# Patient Record
Sex: Female | Born: 1940 | Race: Black or African American | Hispanic: No | Marital: Single | Smoking: Never smoker
Health system: Southern US, Community
[De-identification: ages and names within clinical notes are randomized; demographics above are authoritative.]

## PROBLEM LIST (undated history)

## (undated) DIAGNOSIS — S92919A Unspecified fracture of unspecified toe(s), initial encounter for closed fracture: Secondary | ICD-10-CM

## (undated) DIAGNOSIS — M859 Disorder of bone density and structure, unspecified: Secondary | ICD-10-CM

## (undated) DIAGNOSIS — M8580 Other specified disorders of bone density and structure, unspecified site: Secondary | ICD-10-CM

## (undated) DIAGNOSIS — E7800 Pure hypercholesterolemia, unspecified: Secondary | ICD-10-CM

## (undated) DIAGNOSIS — K219 Gastro-esophageal reflux disease without esophagitis: Secondary | ICD-10-CM

## (undated) DIAGNOSIS — I10 Essential (primary) hypertension: Secondary | ICD-10-CM

## (undated) DIAGNOSIS — K429 Umbilical hernia without obstruction or gangrene: Secondary | ICD-10-CM

## (undated) HISTORY — PX: BUNIONECTOMY: SHX129

## (undated) HISTORY — DX: Unspecified fracture of unspecified toe(s), initial encounter for closed fracture: S92.919A

## (undated) HISTORY — PX: CHOLECYSTECTOMY: SHX55

## (undated) HISTORY — PX: ABDOMINAL HYSTERECTOMY: SHX81

---

## 2004-09-06 ENCOUNTER — Ambulatory Visit: Payer: Self-pay | Admitting: Family Medicine

## 2005-09-22 ENCOUNTER — Ambulatory Visit: Payer: Self-pay | Admitting: Nurse Practitioner

## 2006-11-21 ENCOUNTER — Ambulatory Visit: Payer: Self-pay | Admitting: Family Medicine

## 2008-01-28 ENCOUNTER — Ambulatory Visit: Payer: Self-pay | Admitting: Gastroenterology

## 2008-02-05 ENCOUNTER — Ambulatory Visit: Payer: Self-pay | Admitting: Family Medicine

## 2008-03-11 ENCOUNTER — Ambulatory Visit: Payer: Self-pay | Admitting: Gastroenterology

## 2008-05-12 ENCOUNTER — Ambulatory Visit: Payer: Self-pay | Admitting: Surgery

## 2008-05-12 ENCOUNTER — Ambulatory Visit: Payer: Self-pay | Admitting: Cardiology

## 2008-05-19 ENCOUNTER — Ambulatory Visit: Payer: Self-pay | Admitting: Surgery

## 2009-03-30 ENCOUNTER — Ambulatory Visit: Payer: Self-pay | Admitting: Family Medicine

## 2010-03-31 ENCOUNTER — Ambulatory Visit: Payer: Self-pay | Admitting: Family Medicine

## 2011-06-01 ENCOUNTER — Ambulatory Visit: Payer: Self-pay | Admitting: Family Medicine

## 2011-06-15 ENCOUNTER — Ambulatory Visit: Payer: Self-pay | Admitting: Family Medicine

## 2011-07-14 ENCOUNTER — Ambulatory Visit: Payer: Self-pay | Admitting: Nurse Practitioner

## 2012-06-19 ENCOUNTER — Ambulatory Visit: Payer: Self-pay | Admitting: Family Medicine

## 2013-04-29 ENCOUNTER — Ambulatory Visit: Payer: Self-pay | Admitting: Family Medicine

## 2013-06-21 ENCOUNTER — Ambulatory Visit: Payer: Self-pay | Admitting: Family Medicine

## 2013-09-09 ENCOUNTER — Ambulatory Visit: Payer: Self-pay | Admitting: Family Medicine

## 2014-06-11 ENCOUNTER — Other Ambulatory Visit: Payer: Self-pay

## 2014-06-11 DIAGNOSIS — Z1231 Encounter for screening mammogram for malignant neoplasm of breast: Secondary | ICD-10-CM

## 2014-06-26 ENCOUNTER — Ambulatory Visit
Admission: RE | Admit: 2014-06-26 | Discharge: 2014-06-26 | Disposition: A | Discharge status: Home / Self Care | Payer: Medicare Other | Source: Ambulatory Visit | Attending: Family Medicine | Admitting: Family Medicine

## 2014-06-26 ENCOUNTER — Other Ambulatory Visit: Payer: Self-pay

## 2014-06-26 DIAGNOSIS — Z1231 Encounter for screening mammogram for malignant neoplasm of breast: Secondary | ICD-10-CM | POA: Insufficient documentation

## 2015-01-15 ENCOUNTER — Emergency Department
Admission: EM | Admit: 2015-01-15 | Discharge: 2015-01-15 | Disposition: A | Discharge status: Home / Self Care | Payer: Medicare Other | Attending: Emergency Medicine | Admitting: Emergency Medicine

## 2015-01-15 ENCOUNTER — Encounter: Payer: Self-pay | Admitting: Emergency Medicine

## 2015-01-15 DIAGNOSIS — K5900 Constipation, unspecified: Secondary | ICD-10-CM | POA: Diagnosis not present

## 2015-01-15 DIAGNOSIS — R1030 Lower abdominal pain, unspecified: Secondary | ICD-10-CM

## 2015-01-15 DIAGNOSIS — K649 Unspecified hemorrhoids: Secondary | ICD-10-CM | POA: Diagnosis not present

## 2015-01-15 DIAGNOSIS — I10 Essential (primary) hypertension: Secondary | ICD-10-CM | POA: Diagnosis not present

## 2015-01-15 DIAGNOSIS — Z91040 Latex allergy status: Secondary | ICD-10-CM | POA: Diagnosis not present

## 2015-01-15 DIAGNOSIS — Z79899 Other long term (current) drug therapy: Secondary | ICD-10-CM | POA: Insufficient documentation

## 2015-01-15 HISTORY — DX: Other specified disorders of bone density and structure, unspecified site: M85.80

## 2015-01-15 HISTORY — DX: Disorder of bone density and structure, unspecified: M85.9

## 2015-01-15 HISTORY — DX: Pure hypercholesterolemia, unspecified: E78.00

## 2015-01-15 HISTORY — DX: Essential (primary) hypertension: I10

## 2015-01-15 LAB — URINALYSIS COMPLETE WITH MICROSCOPIC (ARMC ONLY)
BILIRUBIN URINE: NEGATIVE
Bacteria, UA: NONE SEEN
Glucose, UA: NEGATIVE mg/dL
HGB URINE DIPSTICK: NEGATIVE
KETONES UR: NEGATIVE mg/dL
LEUKOCYTES UA: NEGATIVE
NITRITE: NEGATIVE
PH: 5 (ref 5.0–8.0)
Protein, ur: NEGATIVE mg/dL
RBC / HPF: NONE SEEN RBC/hpf (ref 0–5)
Specific Gravity, Urine: 1.011 (ref 1.005–1.030)

## 2015-01-15 LAB — CBC
HEMATOCRIT: 41.4 % (ref 35.0–47.0)
Hemoglobin: 14.1 g/dL (ref 12.0–16.0)
MCH: 29.9 pg (ref 26.0–34.0)
MCHC: 34 g/dL (ref 32.0–36.0)
MCV: 88.1 fL (ref 80.0–100.0)
Platelets: 271 10*3/uL (ref 150–440)
RBC: 4.7 MIL/uL (ref 3.80–5.20)
RDW: 14 % (ref 11.5–14.5)
WBC: 10.3 10*3/uL (ref 3.6–11.0)

## 2015-01-15 MED ORDER — LACTULOSE 10 GM/15ML PO SOLN
30.0000 g | Freq: Once | ORAL | Status: AC
  Administered 2015-01-15: 30 g via ORAL
  Filled 2015-01-15: qty 60

## 2015-01-15 MED ORDER — FLEET ENEMA 7-19 GM/118ML RE ENEM
1.0000 | ENEMA | Freq: Once | RECTAL | Status: AC
  Administered 2015-01-15: 1 via RECTAL

## 2015-01-15 MED ORDER — LACTULOSE 10 GM/15ML PO SOLN: 30.0000 g | Freq: Every day | ORAL | Status: DC | PRN

## 2015-01-15 NOTE — ED Provider Notes (Signed)
[facility] Emergency Department Provider Note  ____________________________________________  Time seen: Approximately 11:39 AM  I have reviewed the triage vital signs and the nursing notes.   HISTORY  Chief Complaint Constipation and Hemorrhoids    HPI Cassie Walters is a 74 y.o. female with a history of chronic constipation, hemorrhoids, presenting with constipation. Patient states that over the past 2 days she has been unable to have a bowel movement despite having the urge. She said that she is straining with no result. She is still passing gas and does not have any nausea or vomiting.  She has some mild suprapubic pressure sensation but no abdominal pain. No fevers or chills. No pain or burning with urination. She has tried stool softener and polyethylene glycol without any result.   Past Medical History  Diagnosis Date  . Hypertension   . Low bone density   . High cholesterol     There are no active problems to display for this patient.   Past Surgical History  Procedure Laterality Date  . Abdominal hysterectomy    . Cholecystectomy    . Bunionectomy Bilateral     Current Outpatient Rx  Name  Route  Sig  Dispense  Refill  . alendronate (FOSAMAX) 70 MG tablet   Oral   Take 70 mg by mouth once a week.          Marland Kitchen amLODipine (NORVASC) 5 MG tablet   Oral   Take 5 mg by mouth daily.         . Calcium Carb-Cholecalciferol (CALCIUM-VITAMIN D) 600-400 MG-UNIT TABS   Oral   Take 1 tablet by mouth 2 (two) times daily.         . cetirizine (ZYRTEC) 10 MG tablet   Oral   Take 10 mg by mouth daily.         Marland Kitchen losartan-hydrochlorothiazide (HYZAAR) 50-12.5 MG tablet   Oral   Take 1 tablet by mouth daily.         Marland Kitchen omeprazole (PRILOSEC) 20 MG capsule   Oral   Take 20 mg by mouth daily.         . pravastatin (PRAVACHOL) 20 MG tablet   Oral   Take 20 mg by mouth daily.         Marland Kitchen lactulose (CHRONULAC) 10 GM/15ML solution    Oral   Take 45 mLs (30 g total) by mouth daily as needed for mild constipation.   90 mL   0     Allergies Latex; Oxycodone; and Sulfa antibiotics  No family history on file.  Social History Social History  Substance Use Topics  . Smoking status: Never Smoker   . Smokeless tobacco: Not on file  . Alcohol Use: No    Review of Systems Constitutional: No fever/chills. No lightheadedness or syncope. Eyes: No visual changes. ENT: No sore throat. Cardiovascular: Denies chest pain, palpitations. Respiratory: Denies shortness of breath.  No cough. Gastrointestinal: No abdominal pain ;: Positive suprapubic pressure..  No nausea, no vomiting.  No diarrhea.  Positive constipation. Genitourinary: Negative for dysuria. Musculoskeletal: Negative for back pain. Skin: Negative for rash. Neurological: Negative for headaches, focal weakness or numbness.  10-point ROS otherwise negative.  ____________________________________________   PHYSICAL EXAM:  VITAL SIGNS: ED Triage Vitals  Enc Vitals Group     BP 01/15/15 0915 142/65 mmHg     Pulse Rate 01/15/15 0916 87     Resp 01/15/15 0916 16     Temp 01/15/15 0916  98.2 F (36.8 C)     Temp Source 01/15/15 0916 Oral     SpO2 01/15/15 0916 98 %     Weight 01/15/15 0916 169 lb 8.5 oz (76.9 kg)     Height 01/15/15 0916 5\' 5"  (1.651 m)     Head Cir --      Peak Flow --      Pain Score --      Pain Loc --      Pain Edu? --      Excl. in GC? --     Constitutional: Alert and oriented. Well appearing and in no acute distress. Answer question appropriately. Eyes: Conjunctivae are normal.  EOMI. no scleral icterus. Head: Atraumatic. Nose: No congestion/rhinnorhea. Mouth/Throat: Mucous membranes are moist.  Neck: No stridor.  Supple.   Cardiovascular: Normal rate, regular rhythm. No murmurs, rubs or gallops.  Respiratory: Normal respiratory effort.  No retractions. Lungs CTAB.  No wheezes, rales or ronchi. Gastrointestinal: Abdomen is  soft, nontender and nondistended. No guarding or rebound. No peritoneal signs. Negative Murphy sign. Patient is a palpable bladder that is approximate 4 cm above the pubic symphysis.  Musculoskeletal: No LE edema.  Neurologic:  Normal speech and language. No gross focal neurologic deficits are appreciated.  Skin:  Skin is warm, dry and intact. No rash noted. Psychiatric: Mood and affect are normal. Speech and behavior are normal.  Normal judgement  ____________________________________________   LABS (all labs ordered are listed, but only abnormal results are displayed)  Labs Reviewed  URINALYSIS COMPLETEWITH MICROSCOPIC ([facility] ONLY) - Abnormal; Notable for the following:    Color, Urine YELLOW (*)    APPearance CLEAR (*)    Squamous Epithelial / LPF 0-5 (*)    All other components within normal limits  CBC   ____________________________________________  EKG  Not indicated ____________________________________________  RADIOLOGY  No results found.  ____________________________________________   PROCEDURES  Procedure(s) performed: None  Critical Care performed: No ____________________________________________   INITIAL IMPRESSION / ASSESSMENT AND PLAN / ED COURSE  Pertinent labs & imaging results that were available during my care of the patient were reviewed by me and considered in my medical decision making (see chart for details).  74 y.o. female with a history of chronic constipation here for the urge to defecate without being able to in the last 2 days. She does not have signs or symptoms that would be concerning with obstructive pathology today. I do not think she has a new surgical pathology either. I will plan to decompress her and reevaluate her for symptomatically relief. She has had some pressure over the suprapubic area so I will get a urine to rule out UTI.   ----------------------------------------- 1:05 PM on  01/15/2015 -----------------------------------------  Patient did have a bowel movement after lactulose and the enema. She is feeling much better. Her labs are reassuring, she has normal hematocrit and stable vital signs, and her urine does not show sign of infection. I'll plan to discharge her home with return precautions as well as follow up instructions. ____________________________________________  FINAL CLINICAL IMPRESSION(S) / ED DIAGNOSES  Final diagnoses:  Constipation, unspecified constipation type  Suprapubic pain, unspecified laterality      NEW MEDICATIONS STARTED DURING THIS VISIT:  New Prescriptions   LACTULOSE (CHRONULAC) 10 GM/15ML SOLUTION    Take 45 mLs (30 g total) by mouth daily as needed for mild constipation.      Rockne MenghiniAnne-Caroline Dade Rodin, MD 01/15/15 1541

## 2015-01-15 NOTE — Discharge Instructions (Signed)
Please continue to eat a high-fiber diet to prevent constipation. Drink plenty of fluid as well. You may continue to take the polyethylene glycol or MiraLAX to help soften stools. If you develop severe constipation, you may take lactulose as prescribed.   Please return to the emergency department if he develops severe abdominal pain, nausea or vomiting, fever, or any other symptoms concerning to you.

## 2015-01-15 NOTE — ED Notes (Signed)
Pt reports she was able to have a BM, pt requesting discharge.

## 2015-01-15 NOTE — ED Notes (Signed)
Pt to ED via EMS from home c/o constipation. LBM yesterday, states she tried to go today but nothing comes out. No c/o pain at this time. Pt reports some minor bleeding which she states is from hemorrhoid. No c/o n/v.

## 2015-06-11 ENCOUNTER — Other Ambulatory Visit: Payer: Self-pay | Admitting: Family Medicine

## 2015-06-11 DIAGNOSIS — Z1231 Encounter for screening mammogram for malignant neoplasm of breast: Secondary | ICD-10-CM

## 2015-06-29 ENCOUNTER — Other Ambulatory Visit: Payer: Self-pay | Admitting: Family Medicine

## 2015-06-29 ENCOUNTER — Ambulatory Visit
Admission: RE | Admit: 2015-06-29 | Discharge: 2015-06-29 | Disposition: A | Discharge status: Home / Self Care | Payer: Medicare Other | Source: Ambulatory Visit | Attending: Family Medicine | Admitting: Family Medicine

## 2015-06-29 DIAGNOSIS — Z1231 Encounter for screening mammogram for malignant neoplasm of breast: Secondary | ICD-10-CM

## 2015-09-02 ENCOUNTER — Other Ambulatory Visit: Payer: Self-pay | Admitting: Family Medicine

## 2015-09-02 DIAGNOSIS — M8580 Other specified disorders of bone density and structure, unspecified site: Secondary | ICD-10-CM

## 2016-06-24 ENCOUNTER — Other Ambulatory Visit: Payer: Self-pay | Admitting: Family Medicine

## 2016-06-24 DIAGNOSIS — Z1231 Encounter for screening mammogram for malignant neoplasm of breast: Secondary | ICD-10-CM

## 2016-07-05 ENCOUNTER — Ambulatory Visit
Admission: RE | Admit: 2016-07-05 | Discharge: 2016-07-05 | Disposition: A | Discharge status: Home / Self Care | Payer: Medicare Other | Source: Ambulatory Visit | Attending: Family Medicine | Admitting: Family Medicine

## 2016-07-05 DIAGNOSIS — Z1231 Encounter for screening mammogram for malignant neoplasm of breast: Secondary | ICD-10-CM | POA: Insufficient documentation

## 2016-09-28 ENCOUNTER — Other Ambulatory Visit: Payer: Self-pay | Admitting: Family Medicine

## 2016-09-28 DIAGNOSIS — Z13820 Encounter for screening for osteoporosis: Secondary | ICD-10-CM

## 2016-11-09 ENCOUNTER — Ambulatory Visit
Admission: RE | Admit: 2016-11-09 | Discharge: 2016-11-09 | Disposition: A | Discharge status: Home / Self Care | Payer: Medicare Other | Source: Ambulatory Visit | Attending: Family Medicine | Admitting: Family Medicine

## 2016-11-09 DIAGNOSIS — Z13820 Encounter for screening for osteoporosis: Secondary | ICD-10-CM | POA: Diagnosis not present

## 2016-11-09 DIAGNOSIS — M85852 Other specified disorders of bone density and structure, left thigh: Secondary | ICD-10-CM | POA: Insufficient documentation

## 2017-01-25 ENCOUNTER — Encounter (INDEPENDENT_AMBULATORY_CARE_PROVIDER_SITE_OTHER): Payer: Self-pay

## 2017-01-25 ENCOUNTER — Ambulatory Visit: Payer: Medicare Other | Admitting: Gastroenterology

## 2017-01-25 ENCOUNTER — Encounter: Payer: Self-pay | Admitting: Gastroenterology

## 2017-01-25 VITALS — BP 153/85 | HR 81 | Temp 98.4°F | Ht 66.0 in | Wt 163.4 lb

## 2017-01-25 DIAGNOSIS — S92919A Unspecified fracture of unspecified toe(s), initial encounter for closed fracture: Secondary | ICD-10-CM

## 2017-01-25 DIAGNOSIS — K5900 Constipation, unspecified: Secondary | ICD-10-CM

## 2017-01-25 DIAGNOSIS — R079 Chest pain, unspecified: Secondary | ICD-10-CM

## 2017-01-25 HISTORY — DX: Unspecified fracture of unspecified toe(s), initial encounter for closed fracture: S92.919A

## 2017-01-25 NOTE — Progress Notes (Signed)
Wyline MoodKiran Clarissa Laird MD, MRCP(U.K) [address]  [address]  [place], [place] [address]  Main: [phone]  Fax: [phone]   Gastroenterology Consultation  Referring Provider:     Leanna SatoMiles, Linda M, MD Primary Care Physician:  Leanna SatoMiles, Linda M, MD Primary Gastroenterologist:  Dr. Wyline MoodKiran Sloane Palmer  Reason for Consultation:     Chest pain         HPI:   Lauretta GrillLouise Walters is a 76 y.o. y/o female referred for consultation & management  by Dr. Marvis MoellerMiles, Doralee AlbinoLinda M, MD.    She was seen 11 days back on 01/14/17 for constipation .Given lactulose and enemas, had a bowel movement and was discharged.   Says she has had chest pain -began a few weeks back, presently no pain, took omeprazole and it helped. Center of her chest , went into her throat . Pain across her chest . No worse with walking . Felt tired. Denied any palpitations. No prior similar episodes . Denies any pain since 10 days. Denies any heart issues in her family. She has high lipids, HTN, no diabetes. Never smoked.   Taking omeprazole daily. 20 mg -she takes it after meals.  On Fosamax- recalls may have just taken fosamax the day she had the chest pain. She has had an EGD in the past. No issues with dysphagia. No weight loss.    She has had constipation for many years. Not taken any laxatives. She has a bowel movement every day - sometimes hard . Takes miralax- daily. Works well for her. Feels she is happy at this time with regards to her constipation.     Past Medical History:  Diagnosis Date  . High cholesterol   . Hypertension   . Low bone density     Past Surgical History:  Procedure Laterality Date  . ABDOMINAL HYSTERECTOMY    . BUNIONECTOMY Bilateral   . CHOLECYSTECTOMY      Prior to Admission medications   Medication Sig Start Date End Date Taking? Authorizing Provider  alendronate (FOSAMAX) 70 MG tablet Take 70 mg by mouth once a week.  12/04/14  Yes [provider]  amLODipine (NORVASC) 5 MG tablet Take 5 mg by mouth  daily.   Yes [provider]  Calcium Carb-Cholecalciferol (CALCIUM-VITAMIN D) 600-400 MG-UNIT TABS Take 1 tablet by mouth 2 (two) times daily.   Yes [provider]  cetirizine (ZYRTEC) 10 MG tablet Take 10 mg by mouth daily.   Yes [provider]  clotrimazole (LOTRIMIN) 1 % cream  01/13/17  Yes [provider]  diclofenac (VOLTAREN) 50 MG EC tablet diclofenac sodium 50 mg tablet,delayed release   Yes [provider]  lactulose (CHRONULAC) 10 GM/15ML solution Take 45 mLs (30 g total) by mouth daily as needed for mild constipation. 01/15/15  Yes Rockne MenghiniNorman, Anne-Caroline, MD  losartan-hydrochlorothiazide (HYZAAR) 50-12.5 MG tablet Take 1 tablet by mouth daily.   Yes [provider]  omeprazole (PRILOSEC) 20 MG capsule Take 20 mg by mouth daily.   Yes [provider]  polyethylene glycol powder (GLYCOLAX/MIRALAX) powder  01/11/17  Yes [provider]  pravastatin (PRAVACHOL) 20 MG tablet Take 20 mg by mouth daily.   Yes [provider]    History reviewed. No pertinent family history.   Social History   Tobacco Use  . Smoking status: Never Smoker  . Smokeless tobacco: Never Used  Substance Use Topics  . Alcohol use: No  . Drug use: No    Allergies as of  01/25/2017 - Review Complete 01/25/2017  Allergen Reaction Noted  . Hydrocodone    . Latex  01/15/2015  . Oxycodone  01/15/2015  . Sulfa antibiotics  01/15/2015    Review of Systems:    All systems reviewed and negative except where noted in HPI.   Physical Exam:  BP (!) 153/85 (BP Location: Left Arm, Patient Position: Sitting, Cuff Size: Normal)   Pulse 81   Temp 98.4 F (36.9 C) (Oral)   Ht 5\' 6"  (1.676 m)   Wt 163 lb 6.4 oz (74.1 kg)   BMI 26.37 kg/m  No LMP recorded. Patient has had a hysterectomy. Psych:  Alert and cooperative. Normal mood and affect. General:   Alert,  Well-developed, well-nourished, pleasant and cooperative in NAD Head:   Normocephalic and atraumatic. Eyes:  Sclera clear, no icterus.   Conjunctiva pink. Ears:  Normal auditory acuity. Nose:  No deformity, discharge, or lesions. Mouth:  No deformity or lesions,oropharynx pink & moist. Neck:  Supple; no masses or thyromegaly. Lungs:  Respirations even and unlabored.  Clear throughout to auscultation.   No wheezes, crackles, or rhonchi. No acute distress. Heart:  Regular rate and rhythm; no murmurs, clicks, rubs, or gallops. Abdomen:  Normal bowel sounds.  No bruits.  Soft, non-tender and non-distended without masses, hepatosplenomegaly or hernias noted.  No guarding or rebound tenderness.    Msk:  Symmetrical without gross deformities. Good, equal movement & strength bilaterally. Pulses:  Normal pulses noted. Extremities:  No clubbing or edema.  No cyanosis. Neurologic:  Alert and oriented x3;  grossly normal neurologically. Skin:  Intact without significant lesions or rashes. No jaundice. Lymph Nodes:  No significant cervical adenopathy. Psych:  Alert and cooperative. Normal mood and affect.  Imaging Studies: No results found.  Assessment and Plan:   Lauretta GrillLouise Mees is a 76 y.o. y/o female has been referred for chest pain and constipation   Plan  1.Refer to cardiologist - she has some cardiovascular risk factors. R/o angina. Her chest pain is not typical of angina , could also be from pill esophagitis (fosamax) or acute esophagitis. Presently has no symptoms and hence does not warrant any further endoscopic evaluation . Symptoms were for a very short period of time. D/c PPI in 2 weeks  2. Constipation- long standing doing well on miralax- suggest high fiber diet- she is not keen on a colonoscopy ( I did offer her )   Follow up in 8 weeks if has recurrence of symptoms will consider further evaluation.   Dr Wyline MoodKiran Ariston Grandison MD,MRCP(U.K)

## 2017-02-22 ENCOUNTER — Ambulatory Visit: Payer: Medicare Other | Admitting: Gastroenterology

## 2017-03-09 ENCOUNTER — Ambulatory Visit (INDEPENDENT_AMBULATORY_CARE_PROVIDER_SITE_OTHER): Payer: Medicare Other | Admitting: Cardiovascular Disease

## 2017-03-09 ENCOUNTER — Encounter: Payer: Self-pay | Admitting: Cardiovascular Disease

## 2017-03-09 VITALS — BP 130/66 | HR 78 | Ht 64.0 in | Wt 165.0 lb

## 2017-03-09 DIAGNOSIS — E785 Hyperlipidemia, unspecified: Secondary | ICD-10-CM

## 2017-03-09 DIAGNOSIS — I10 Essential (primary) hypertension: Secondary | ICD-10-CM

## 2017-03-09 DIAGNOSIS — R079 Chest pain, unspecified: Secondary | ICD-10-CM | POA: Diagnosis not present

## 2017-03-09 NOTE — Progress Notes (Signed)
Cardiology Office Note   Date:  03/09/2017   ID:  Cassie GrillLouise Albea, DOB 14-Mar-1940, MRN [id number]  PCP:  Iran OuchArida, Yousif Edelson A, MD  Cardiologist:   Lorine BearsMuhammad Kasidi Shanker, MD   Chief Complaint  Patient presents with  . New Patient (Initial Visit)    Per Dr. Tobi BastosAnna in gastro for Chest pain. Meds reviewed verbally with patient.       History of Present Illness: Cassie Walters is a 77 y.o. female who was referred by Dr. Tobi BastosAnna for evaluation of chest pain.  She has no prior cardiac history.  She has known history of hypertension and hyperlipidemia.  No previous cardiac testing. She reports intermittent episodes of chest pain.  She describes different types of pain.  She clearly describes discomfort swallowing certain foods and sometimes chest with swallowing.  She is on Fosamax.  She also describes substernal tightness feeling which happens with certain activities.  No shortness of breath, orthopnea or PND.  No significant leg edema. She is a lifelong non-smoker and has no family history of premature coronary artery disease.  Past Medical History:  Diagnosis Date  . High cholesterol   . Hypertension   . Low bone density     Past Surgical History:  Procedure Laterality Date  . ABDOMINAL HYSTERECTOMY    . BUNIONECTOMY Bilateral   . CHOLECYSTECTOMY       Current Outpatient Medications  Medication Sig Dispense Refill  . alendronate (FOSAMAX) 70 MG tablet Take 70 mg by mouth once a week.     Marland Kitchen. amLODipine (NORVASC) 5 MG tablet Take 5 mg by mouth daily.    . Calcium Carb-Cholecalciferol (CALCIUM-VITAMIN D) 600-400 MG-UNIT TABS Take 1 tablet by mouth 2 (two) times daily.    . cetirizine (ZYRTEC) 10 MG tablet Take 10 mg by mouth daily.    . clotrimazole (LOTRIMIN) 1 % cream     . diclofenac (VOLTAREN) 50 MG EC tablet diclofenac sodium 50 mg tablet,delayed release    . lactulose (CHRONULAC) 10 GM/15ML solution Take 45 mLs (30 g total) by mouth daily as needed for mild constipation. 90 mL 0  .  losartan-hydrochlorothiazide (HYZAAR) 50-12.5 MG tablet Take 1 tablet by mouth daily.    . polyethylene glycol powder (GLYCOLAX/MIRALAX) powder     . pravastatin (PRAVACHOL) 20 MG tablet Take 20 mg by mouth daily.     No current facility-administered medications for this visit.     Allergies:   Hydrocodone; Latex; Oxycodone; and Sulfa antibiotics    Social History:  The patient  reports that  has never smoked. she has never used smokeless tobacco. She reports that she does not drink alcohol or use drugs.   Family History:  The patient's family history is negative for coronary artery disease.   ROS:  Please see the history of present illness.   Otherwise, review of systems are positive for none.   All other systems are reviewed and negative.    PHYSICAL EXAM: VS:  BP 130/66 (BP Location: Right Arm, Patient Position: Sitting, Cuff Size: Normal)   Pulse 78   Ht 5\' 4"  (1.626 m)   Wt 165 lb (74.8 kg)   BMI 28.32 kg/m  , BMI Body mass index is 28.32 kg/m. GEN: Well nourished, well developed, in no acute distress  HEENT: normal  Neck: no JVD, carotid bruits, or masses Cardiac: RRR; no murmurs, rubs, or gallops,no edema  Respiratory:  clear to auscultation bilaterally, normal work of breathing GI: soft, nontender, nondistended, + BS MS:  no deformity or atrophy  Skin: warm and dry, no rash Neuro:  Strength and sensation are intact Psych: euthymic mood, full affect   EKG:  EKG is ordered today. The ekg ordered today demonstrates normal sinus rhythm with no significant ST or T wave changes.   Recent Labs: No results found for requested labs within last 8760 hours.    Lipid Panel No results found for: CHOL, TRIG, HDL, CHOLHDL, VLDL, LDLCALC, LDLDIRECT    Wt Readings from Last 3 Encounters:  03/09/17 165 lb (74.8 kg)  01/25/17 163 lb 6.4 oz (74.1 kg)  01/15/15 169 lb 8.5 oz (76.9 kg)        PAD Screen 03/09/2017  Previous PAD dx? No  Previous surgical procedure? No    Pain with walking? No  Feet/toe relief with dangling? No  Painful, non-healing ulcers? No  Extremities discolored? No      ASSESSMENT AND PLAN:  1.  Atypical chest pain: She has 2 components.  One component seems to be due to esophagitis or dysphagia could be related to treatment with Fosamax.  However, she also describes exertional substernal chest tightness which is suggestive of mild angina. Risk factors include hypertension and hyperlipidemia.  I requested a pharmacologic nuclear stress test for evaluation.  She is not able to exercise on a treadmill.  2.  Essential hypertension: Blood pressure is controlled on current medications.  3.  Hyperlipidemia: Currently on pravastatin.    Disposition:   FU with me as needed.   Signed,  Lorine Bears, MD  03/09/2017 2:51 PM    [facility]

## 2017-03-09 NOTE — Patient Instructions (Addendum)
Medication Instructions:  Your physician recommends that you continue on your current medications as directed. Please refer to the Current Medication list given to you today.   Labwork: none  Testing/Procedures: [facility] MYOVIEW  Your caregiver has ordered a Stress Test with nuclear imaging. The purpose of this test is to evaluate the blood supply to your heart muscle. This procedure is referred to as a "Non-Invasive Stress Test." This is because other than having an IV started in your vein, nothing is inserted or "invades" your body. Cardiac stress tests are done to find areas of poor blood flow to the heart by determining the extent of coronary artery disease (CAD). Some patients exercise on a treadmill, which naturally increases the blood flow to your heart, while others who are  unable to walk on a treadmill due to physical limitations have a pharmacologic/chemical stress agent called Lexiscan . This medicine will mimic walking on a treadmill by temporarily increasing your coronary blood flow.   Please note: these test may take anywhere between 2-4 hours to complete  PLEASE REPORT TO [facility] ENTRANCE  THE VOLUNTEERS AT THE FIRST DESK WILL DIRECT YOU WHERE TO GO  Date of Procedure: February 6  Arrival Time for Procedure: 7:45am  Instructions regarding medication:   Hold losartan-hctz the morning of your test.   PLEASE NOTIFY THE OFFICE AT LEAST 24 HOURS IN ADVANCE IF YOU ARE UNABLE TO KEEP YOUR APPOINTMENT.  [phone] AND  PLEASE NOTIFY NUCLEAR MEDICINE AT [facility] AT LEAST 24 HOURS IN ADVANCE IF YOU ARE UNABLE TO KEEP YOUR APPOINTMENT. [phone]  How to prepare for your Myoview test:  1. Do not eat or drink after midnight 2. No caffeine for 24 hours prior to test 3. No smoking 24 hours prior to test. 4. Your medication may be taken with water.  If your doctor stopped a medication because of this test, do not take that medication. 5. Ladies, please do not wear dresses.   Skirts or pants are appropriate. Please wear a short sleeve shirt. 6. No perfume, cologne or lotion. 7. Wear comfortable walking shoes. No heels!            Follow-Up: Your physician recommends that you schedule a follow-up appointment as needed   Any Other Special Instructions Will Be Listed Below (If Applicable).     If you need a refill on your cardiac medications before your next appointment, please call your pharmacy.  Cardiac Nuclear Scan A cardiac nuclear scan is a test that measures blood flow to the heart when a person is resting and when he or she is exercising. The test looks for problems such as:  Not enough blood reaching a portion of the heart.  The heart muscle not working normally.  You may need this test if:  You have heart disease.  You have had abnormal lab results.  You have had heart surgery or angioplasty.  You have chest pain.  You have shortness of breath.  In this test, a radioactive dye (tracer) is injected into your bloodstream. After the tracer has traveled to your heart, an imaging device is used to measure how much of the tracer is absorbed by or distributed to various areas of your heart. This procedure is usually done at a hospital and takes 2-4 hours. Tell a health care provider about:  Any allergies you have.  All medicines you are taking, including vitamins, herbs, eye drops, creams, and over-the-counter medicines.  Any problems you or family members have  had with the use of anesthetic medicines.  Any blood disorders you have.  Any surgeries you have had.  Any medical conditions you have.  Whether you are pregnant or may be pregnant. What are the risks? Generally, this is a safe procedure. However, problems may occur, including:  Serious chest pain and heart attack. This is only a risk if the stress portion of the test is done.  Rapid heartbeat.  Sensation of warmth in your chest. This usually passes quickly.  What  happens before the procedure?  Ask your health care provider about changing or stopping your regular medicines. This is especially important if you are taking diabetes medicines or blood thinners.  Remove your jewelry on the day of the procedure. What happens during the procedure?  An IV tube will be inserted into one of your veins.  Your health care provider will inject a small amount of radioactive tracer through the tube.  You will wait for 20-40 minutes while the tracer travels through your bloodstream.  Your heart activity will be monitored with an electrocardiogram (ECG).  You will lie down on an exam table.  Images of your heart will be taken for about 15-20 minutes.  You may be asked to exercise on a treadmill or stationary bike. While you exercise, your heart's activity will be monitored with an ECG, and your blood pressure will be checked. If you are unable to exercise, you may be given a medicine to increase blood flow to parts of your heart.  When blood flow to your heart has peaked, a tracer will again be injected through the IV tube.  After 20-40 minutes, you will get back on the exam table and have more images taken of your heart.  When the procedure is over, your IV tube will be removed. The procedure may vary among health care providers and hospitals. Depending on the type of tracer used, scans may need to be repeated 3-4 hours later. What happens after the procedure?  Unless your health care provider tells you otherwise, you may return to your normal schedule, including diet, activities, and medicines.  Unless your health care provider tells you otherwise, you may increase your fluid intake. This will help flush the contrast dye from your body. Drink enough fluid to keep your urine clear or pale yellow.  It is up to you to get your test results. Ask your health care provider, or the department that is doing the test, when your results will be ready. Summary  A  cardiac nuclear scan measures the blood flow to the heart when a person is resting and when he or she is exercising.  You may need this test if you are at risk for heart disease.  Tell your health care provider if you are pregnant.  Unless your health care provider tells you otherwise, increase your fluid intake. This will help flush the contrast dye from your body. Drink enough fluid to keep your urine clear or pale yellow. This information is not intended to replace advice given to you by your health care provider. Make sure you discuss any questions you have with your health care provider. Document Released: 02/19/2004 Document Revised: 01/27/2016 Document Reviewed: 01/02/2013 Elsevier Interactive Patient Education  2017 [employer]

## 2017-03-12 ENCOUNTER — Encounter: Payer: Self-pay | Admitting: Emergency Medicine

## 2017-03-12 ENCOUNTER — Emergency Department
Admission: EM | Admit: 2017-03-12 | Discharge: 2017-03-12 | Disposition: A | Discharge status: Home / Self Care | Payer: Medicare Other | Attending: Emergency Medicine | Admitting: Emergency Medicine

## 2017-03-12 ENCOUNTER — Other Ambulatory Visit: Payer: Self-pay

## 2017-03-12 ENCOUNTER — Emergency Department: Payer: Medicare Other

## 2017-03-12 DIAGNOSIS — N200 Calculus of kidney: Secondary | ICD-10-CM | POA: Insufficient documentation

## 2017-03-12 DIAGNOSIS — I10 Essential (primary) hypertension: Secondary | ICD-10-CM | POA: Insufficient documentation

## 2017-03-12 DIAGNOSIS — K429 Umbilical hernia without obstruction or gangrene: Secondary | ICD-10-CM | POA: Insufficient documentation

## 2017-03-12 DIAGNOSIS — Z79899 Other long term (current) drug therapy: Secondary | ICD-10-CM | POA: Insufficient documentation

## 2017-03-12 DIAGNOSIS — Z91040 Latex allergy status: Secondary | ICD-10-CM | POA: Diagnosis not present

## 2017-03-12 DIAGNOSIS — R109 Unspecified abdominal pain: Secondary | ICD-10-CM | POA: Diagnosis present

## 2017-03-12 LAB — URINALYSIS, COMPLETE (UACMP) WITH MICROSCOPIC
BILIRUBIN URINE: NEGATIVE
Bacteria, UA: NONE SEEN
Glucose, UA: NEGATIVE mg/dL
HGB URINE DIPSTICK: NEGATIVE
KETONES UR: NEGATIVE mg/dL
Leukocytes, UA: NEGATIVE
NITRITE: NEGATIVE
PROTEIN: NEGATIVE mg/dL
SPECIFIC GRAVITY, URINE: 1.028 (ref 1.005–1.030)
Squamous Epithelial / LPF: NONE SEEN
pH: 6 (ref 5.0–8.0)

## 2017-03-12 LAB — CBC
HEMATOCRIT: 41.6 % (ref 35.0–47.0)
HEMOGLOBIN: 13.7 g/dL (ref 12.0–16.0)
MCH: 29.3 pg (ref 26.0–34.0)
MCHC: 32.9 g/dL (ref 32.0–36.0)
MCV: 88.9 fL (ref 80.0–100.0)
Platelets: 241 10*3/uL (ref 150–440)
RBC: 4.67 MIL/uL (ref 3.80–5.20)
RDW: 13.8 % (ref 11.5–14.5)
WBC: 7.3 10*3/uL (ref 3.6–11.0)

## 2017-03-12 LAB — COMPREHENSIVE METABOLIC PANEL
ALBUMIN: 3.9 g/dL (ref 3.5–5.0)
ALT: 21 U/L (ref 14–54)
ANION GAP: 8 (ref 5–15)
AST: 31 U/L (ref 15–41)
Alkaline Phosphatase: 40 U/L (ref 38–126)
BUN: 18 mg/dL (ref 6–20)
CO2: 23 mmol/L (ref 22–32)
Calcium: 9.9 mg/dL (ref 8.9–10.3)
Chloride: 107 mmol/L (ref 101–111)
Creatinine, Ser: 1.19 mg/dL — ABNORMAL HIGH (ref 0.44–1.00)
GFR calc non Af Amer: 43 mL/min — ABNORMAL LOW (ref >60)
GFR, EST AFRICAN AMERICAN: 49 mL/min — AB (ref >60)
GLUCOSE: 103 mg/dL — AB (ref 65–99)
POTASSIUM: 3.4 mmol/L — AB (ref 3.5–5.1)
Sodium: 138 mmol/L (ref 135–145)
TOTAL PROTEIN: 6.9 g/dL (ref 6.5–8.1)
Total Bilirubin: 0.7 mg/dL (ref 0.3–1.2)

## 2017-03-12 LAB — LIPASE, BLOOD: Lipase: 29 U/L (ref 11–51)

## 2017-03-12 MED ORDER — SODIUM CHLORIDE 0.9 % IV BOLUS (SEPSIS)
500.0000 mL | Freq: Once | INTRAVENOUS | Status: AC
  Administered 2017-03-12: 500 mL via INTRAVENOUS

## 2017-03-12 MED ORDER — IOPAMIDOL (ISOVUE-300) INJECTION 61%
75.0000 mL | Freq: Once | INTRAVENOUS | Status: AC | PRN
  Administered 2017-03-12: 75 mL via INTRAVENOUS

## 2017-03-12 MED ORDER — ONDANSETRON HCL 4 MG PO TABS: 4.0000 mg | ORAL_TABLET | Freq: Every day | ORAL | 1 refills | Status: AC | PRN

## 2017-03-12 NOTE — ED Notes (Signed)
Pt given cup to try to obtain urine sample

## 2017-03-12 NOTE — ED Triage Notes (Signed)
FIRST NURSE NOTE-right flank/RLQ abdominal pain. Ambulatory in, placed in wheelchair.  NAD

## 2017-03-12 NOTE — ED Provider Notes (Signed)
[facility] Provider Note ____________________________________________   First MD Initiated Contact with Patient 03/12/17 1000     (approximate)  I have reviewed the triage vital signs and the nursing notes.   HISTORY  Chief Complaint Abdominal Pain   HPI Lauretta GrillLouise Kienast is a 77 y.o. female history of previous hysterectomy, cholecystectomy, and the patient also believes a distant appendectomy.  Patient reports this morning she woke up, at about 6 AM she was struck by a sudden discomfort in her right flank and urgency urinate.  She has been able to urinate, but reports that feeling like she just needs to urinate and having pain in the right flank and right lower back region that radiates somewhat towards her right groin.  Some nausea but no vomiting.  The pain was worse, has settled down now and she currently reports not being any pain.  She denies ongoing nausea at present.  Reports never had these symptoms before.  No numbness tingling or weakness.  No chest pain. No fevers or chills.  No burning with urination but does have a urgency to urinate Past Medical History:  Diagnosis Date  . High cholesterol   . Hypertension   . Low bone density     Patient Active Problem List   Diagnosis Date Noted  . Closed fracture of phalanx of foot 01/25/2017    Past Surgical History:  Procedure Laterality Date  . ABDOMINAL HYSTERECTOMY    . BUNIONECTOMY Bilateral   . CHOLECYSTECTOMY      Prior to Admission medications   Medication Sig Start Date End Date Taking? Authorizing Provider  alendronate (FOSAMAX) 70 MG tablet Take 70 mg by mouth once a week.  12/04/14   [provider]  amLODipine (NORVASC) 5 MG tablet Take 5 mg by mouth daily.    [provider]  cetirizine (ZYRTEC) 10 MG tablet Take 10 mg by mouth daily.    [provider]  clotrimazole (LOTRIMIN) 1 % cream  01/13/17   [provider]  diclofenac  (VOLTAREN) 50 MG EC tablet diclofenac sodium 50 mg tablet,delayed release    [provider]  lactulose (CHRONULAC) 10 GM/15ML solution Take 45 mLs (30 g total) by mouth daily as needed for mild constipation. 01/15/15   Rockne MenghiniNorman, Anne-Caroline, MD  losartan-hydrochlorothiazide (HYZAAR) 50-12.5 MG tablet Take 1 tablet by mouth daily.    [provider]  ondansetron (ZOFRAN) 4 MG tablet Take 1 tablet (4 mg total) by mouth daily as needed for nausea or vomiting. 03/12/17 03/12/18  Sharyn CreamerQuale, Mark, MD  polyethylene glycol powder (GLYCOLAX/MIRALAX) powder  01/11/17   [provider]  pravastatin (PRAVACHOL) 20 MG tablet Take 20 mg by mouth daily.    [provider]    Allergies Hydrocodone; Latex; Oxycodone; and Sulfa antibiotics  History reviewed. No pertinent family history.  Social History Social History   Tobacco Use  . Smoking status: Never Smoker  . Smokeless tobacco: Never Used  Substance Use Topics  . Alcohol use: No  . Drug use: No    Review of Systems onstitutional: No fever/chills Eyes: No visual changes. ENT: No sore throat. Cardiovascular: Denies chest pain. Respiratory: Denies shortness of breath. Gastrointestinal: No diarrhea.  No constipation. Genitourinary: Negative for dysuria.  He HPI Musculoskeletal: Negative for back pain.  See HPI, reports pain in the right flank, not in the back itself Skin: Negative for rash. Neurological: Negative for headaches, focal weakness or numbness.  ____________________________________________   PHYSICAL EXAM:  VITAL SIGNS: ED Triage Vitals  Enc Vitals Group     BP 03/12/17 0850 (!) 125/112     Pulse Rate 03/12/17 0850 89     Resp 03/12/17 0850 14     Temp 03/12/17 0850 98.5 F (36.9 C)     Temp Source 03/12/17 0850 Oral     SpO2 03/12/17 0850 99 %     Weight 03/12/17 0850 165 lb (74.8 kg)     Height 03/12/17 0850 5\' 4"  (1.626 m)     Head Circumference --      Peak Flow --      Pain Score  03/12/17 0853 7     Pain Loc --      Pain Edu? --      Excl. in GC? --     Constitutional: Alert and oriented. Well appearing and in no acute distress. Eyes: Conjunctivae are normal. Head: Atraumatic. Nose: No congestion/rhinnorhea. Mouth/Throat: Mucous membranes are moist. Neck: No stridor.   Cardiovascular: Normal rate, regular rhythm. Grossly normal heart sounds.  Good peripheral circulation. Respiratory: Normal respiratory effort.  No retractions. Lungs CTAB. Gastrointestinal: Soft and nontender except for mild tenderness in the right flank and right lower quadrant.  No focal pain to McBurney's point.  No peritonitis rebound or guarding.. No distention.  Negative Murphy.  No CVA tenderness bilateral. Musculoskeletal: No lower extremity tenderness nor edema. Neurologic:  Normal speech and language. No gross focal neurologic deficits are appreciated.  Skin:  Skin is warm, dry and intact. No rash noted. Psychiatric: Mood and affect are normal. Speech and behavior are normal.  ____________________________________________   LABS (all labs ordered are listed, but only abnormal results are displayed)  Labs Reviewed  COMPREHENSIVE METABOLIC PANEL - Abnormal; Notable for the following components:      Result Value   Potassium 3.4 (*)    Glucose, Bld 103 (*)    Creatinine, Ser 1.19 (*)    GFR calc non Af Amer 43 (*)    GFR calc Af Amer 49 (*)    All other components within normal limits  URINALYSIS, COMPLETE (UACMP) WITH MICROSCOPIC - Abnormal; Notable for the following components:   Color, Urine YELLOW (*)    APPearance CLEAR (*)    All other components within normal limits  LIPASE, BLOOD  CBC   ____________________________________________  EKG   ____________________________________________  RADIOLOGY  CT scan reviewed by me, the 4 mm right ureteral calculus with mild hydronephrosis  ____________________________________________   PROCEDURES  Procedure(s) performed:  None  Procedures  Critical Care performed: No  ____________________________________________   INITIAL IMPRESSION / ASSESSMENT AND PLAN / ED COURSE  Pertinent labs & imaging results that were available during my care of the patient were reviewed by me and considered in my medical decision making (see chart for details).  Differential diagnosis includes but is not limited to, abdominal perforation, aortic dissection, cholecystitis, appendicitis, diverticulitis, colitis, esophagitis/gastritis, kidney stone, pyelonephritis, urinary tract infection, aortic aneurysm. All are considered in decision and treatment plan. Based upon the patient's presentation and risk factors, given the patient's age and complaint I have ordered a CT scan to further evaluate.  The fact that her pain seems to have subsided now except to palpation in the right flank is reassuring, she reports she is feeling improved her symptoms seem to suggest a renal etiology such as a possible kidney stone, though given her age vascular etiology ischemia etc. is also considered.  We will proceed with IV contrast CT.  Patient  does not wish for any pain or nausea medicine at present and appears well.     ----------------------------------------- 12:14 PM on 03/12/2017 -----------------------------------------  Reevaluated, patient reports pain and symptom-free.  On exam her abdomen soft nontender no pain reported.  She reports she is feeling improved, no complaints at present.  CT was reviewed, she does have a soft nontender and reducible umbilical hernia, but she denies it causing her any pain or discomfort.  No vomiting.  Clinically she does not appear to have evidence of a small bowel obstruction.  Clinically, she does have evidence to support the passage of a right-sided kidney stone.  Awaiting urinalysis at this time.  Discussed and reviewed exam and CT findings with Dr. Excell Seltzer of general surgery, he advises the patient may be  discharged to follow-up and he can see her in clinic this week regarding her abdominal hernia.  ----------------------------------------- 12:23 PM on 03/12/2017 -----------------------------------------  Patient reports pain and symptom-free.  Resting comfortably.  Discussed and requested to provide her prescription for pain medicine, patient does not wish for any.  She instead advised that she will use Tylenol as needed.  Currently not have any pain.  She is agreeable to following up with both urology and general surgery. Return precautions and treatment recommendations and follow-up discussed with the patient who is agreeable with the plan.  ____________________________________________   FINAL CLINICAL IMPRESSION(S) / ED DIAGNOSES  Final diagnoses:  Kidney stone  Umbilical hernia without obstruction and without gangrene      NEW MEDICATIONS STARTED DURING THIS VISIT:  New Prescriptions   ONDANSETRON (ZOFRAN) 4 MG TABLET    Take 1 tablet (4 mg total) by mouth daily as needed for nausea or vomiting.     Note:  This document was prepared using Dragon voice recognition software and may include unintentional dictation errors.     Sharyn Creamer, MD 03/12/17 [id number]

## 2017-03-12 NOTE — ED Triage Notes (Signed)
Pt c/o lower abdominal pain at this time. Pt states she feels like she "needs to make water" but she can't, reports last urination at approx 0700 this morning. Pt denies burning with urination. C/o lower abdominal pain with radiation to her R flank at this time.

## 2017-03-12 NOTE — ED Notes (Signed)
Pt family informed of d/c.  Spoke with Jomarie LongsJoseph.

## 2017-03-15 ENCOUNTER — Ambulatory Visit
Admission: RE | Admit: 2017-03-15 | Discharge: 2017-03-15 | Disposition: A | Discharge status: Home / Self Care | Payer: Medicare Other | Source: Ambulatory Visit | Attending: Cardiovascular Disease | Admitting: Cardiovascular Disease

## 2017-03-15 DIAGNOSIS — R079 Chest pain, unspecified: Secondary | ICD-10-CM | POA: Diagnosis present

## 2017-03-15 LAB — NM MYOCAR MULTI W/SPECT W/WALL MOTION / EF
CSEPEDS: 0 s
CSEPHR: 74 %
Estimated workload: 1 METS
Exercise duration (min): 0 min
LV dias vol: 51 mL (ref 46–106)
LVSYSVOL: 19 mL
MPHR: 142 {beats}/min
Peak HR: 106 {beats}/min
Rest HR: 61 {beats}/min
TID: 0.78

## 2017-03-15 MED ORDER — TECHNETIUM TC 99M TETROFOSMIN IV KIT
13.0000 | PACK | Freq: Once | INTRAVENOUS | Status: AC | PRN
  Administered 2017-03-15: 13.81 via INTRAVENOUS

## 2017-03-15 MED ORDER — REGADENOSON 0.4 MG/5ML IV SOLN
0.4000 mg | Freq: Once | INTRAVENOUS | Status: AC
  Administered 2017-03-15: 0.4 mg via INTRAVENOUS

## 2017-03-15 MED ORDER — TECHNETIUM TC 99M TETROFOSMIN IV KIT
30.0000 | PACK | Freq: Once | INTRAVENOUS | Status: AC | PRN
  Administered 2017-03-15: 32.91 via INTRAVENOUS

## 2017-03-16 ENCOUNTER — Ambulatory Visit: Payer: Medicare Other | Admitting: Gastroenterology

## 2017-03-16 ENCOUNTER — Encounter: Payer: Self-pay | Admitting: Surgery

## 2017-03-16 ENCOUNTER — Encounter: Payer: Self-pay | Admitting: Gastroenterology

## 2017-03-16 ENCOUNTER — Ambulatory Visit: Payer: Medicare Other | Admitting: Surgery

## 2017-03-16 VITALS — BP 130/66 | HR 83 | Temp 98.1°F | Ht 64.0 in | Wt 161.2 lb

## 2017-03-16 VITALS — BP 156/72 | HR 75 | Temp 98.1°F | Ht 64.0 in | Wt 159.0 lb

## 2017-03-16 DIAGNOSIS — R079 Chest pain, unspecified: Secondary | ICD-10-CM | POA: Diagnosis not present

## 2017-03-16 DIAGNOSIS — K5900 Constipation, unspecified: Secondary | ICD-10-CM

## 2017-03-16 DIAGNOSIS — K429 Umbilical hernia without obstruction or gangrene: Secondary | ICD-10-CM

## 2017-03-16 NOTE — Progress Notes (Signed)
Cassie GrillLouise Walters is an 77 y.o. female.   Chief Complaint: Umbilical hernia HPI: This a patient who was in the emergency room recently for what appeared to be urologic problems but a CT scan suggested an umbilical hernia.  She has had a prior hysterectomy and this is suggestive of a ventral hernia which was completely reducible in the ER. Her pain in the ED was in the right flank.  She is never had any pain in the periumbilical area.  She is known about having an umbilical hernia for many years.  It is never given her any trouble.  Denies nausea vomiting fevers or chills.  Past Medical History:  Diagnosis Date  . Closed fracture of phalanx of foot 01/25/2017  . High cholesterol   . Hypertension   . Low bone density     Past Surgical History:  Procedure Laterality Date  . ABDOMINAL HYSTERECTOMY    . BUNIONECTOMY Bilateral   . CHOLECYSTECTOMY      Family History  Problem Relation Age of Onset  . Hypertension Mother   . Dementia Mother   . Hypertension Father   . Lung cancer Brother   . Lung cancer Brother    Social History:  reports that  has never smoked. she has never used smokeless tobacco. She reports that she does not drink alcohol or use drugs.  Allergies:  Allergies  Allergen Reactions  . Hydrocodone   . Latex   . Oxycodone   . Sulfa Antibiotics      (Not in a hospital admission)   Review of Systems:   Review of Systems  Constitutional: Negative.   HENT: Negative.   Eyes: Negative.   Respiratory: Negative.   Cardiovascular: Negative.   Gastrointestinal: Negative.   Genitourinary: Negative.   Musculoskeletal: Negative.   Skin: Negative.   Neurological: Negative.   Endo/Heme/Allergies: Negative.   Psychiatric/Behavioral: Negative.     Physical Exam:  Physical Exam  Constitutional: She is oriented to person, place, and time and well-developed, well-nourished, and in no distress. No distress.  HENT:  Head: Normocephalic and atraumatic.  Eyes: Pupils  are equal, round, and reactive to light. Right eye exhibits no discharge. Left eye exhibits no discharge. No scleral icterus.  Neck: Normal range of motion. No JVD present.  Cardiovascular: Normal rate and regular rhythm.  Pulmonary/Chest: Effort normal and breath sounds normal. No respiratory distress. She has no wheezes. She has no rales.  Abdominal: Soft. She exhibits no distension. There is no tenderness. There is no rebound and no guarding.  [place] umbilical hernia.  Left paramedian incision which wanders towards the midline caudad.  Hernia is easily reducible and nontender  Musculoskeletal: Normal range of motion. She exhibits no edema or tenderness.  Lymphadenopathy:    She has no cervical adenopathy.  Neurological: She is alert and oriented to person, place, and time.  Skin: Skin is warm and dry. No rash noted. She is not diaphoretic. No erythema.  Psychiatric: Mood and affect normal.  Vitals reviewed.   Blood pressure (!) 156/72, pulse 75, temperature 98.1 F (36.7 C), temperature source Oral, height 5\' 4"  (1.626 m), weight 159 lb (72.1 kg).    No results found for this or any previous visit (from the past 48 hour(s)). Nm Myocar Multi W/spect W/wall Motion / Ef  Result Date: 03/15/2017  Normal pharmacologic myocardial perfusion stress test without ischemia or scar.  The left ventricular ejection fraction is hyperdynamic (>65%). Wall motion is normal.  This is a low risk study.  Assessment/Plan Small umbilical hernia.  Her hysterectomy incision appears to be somewhat left paramedian.  This likely represents an umbilical hernia by palpation.  It is quite [place].  Discussed options with the patient.  As she is not having any symptoms she would like to forego any sort of surgical intervention at this time.  I reminded her that she can follow-up with [place] at any time should she start having symptoms from this umbilical hernia.  Lattie Haw, MD, FACS

## 2017-03-16 NOTE — Progress Notes (Signed)
Wyline MoodKiran Lenda Baratta MD, MRCP(U.K) [address]  [address]  [place], [place] [address]  Main: [phone]  Fax: [phone]   Primary Care Physician: Leanna SatoMiles, Linda M, MD  Primary Gastroenterologist:  Dr. Wyline MoodKiran Anjel Pardo   Chief Complaint  Patient presents with  . Follow-up  . Fatigue    HPI: Lauretta GrillLouise Bolte is a 77 y.o. female    Summary of history :  She was initially referred an seen on 12/18 for constipation . Last visit had GERD, one episode of chest pain after a dose of Fosamax.   Interval history   01/25/2017-  03/16/2016   Seen by Dr Kirke CorinArida for chest pain and had a nuclear stress test ordered.   No further chest pains. No issues with constipation with miralax.    Current Outpatient Medications  Medication Sig Dispense Refill  . alendronate (FOSAMAX) 70 MG tablet Take 70 mg by mouth once a week.     Marland Kitchen. amLODipine (NORVASC) 5 MG tablet Take 5 mg by mouth daily.    . cetirizine (ZYRTEC) 10 MG tablet Take 10 mg by mouth daily.    . clotrimazole (LOTRIMIN) 1 % cream     . diclofenac (VOLTAREN) 50 MG EC tablet diclofenac sodium 50 mg tablet,delayed release    . lactulose (CHRONULAC) 10 GM/15ML solution Take 45 mLs (30 g total) by mouth daily as needed for mild constipation. 90 mL 0  . losartan-hydrochlorothiazide (HYZAAR) 50-12.5 MG tablet Take 1 tablet by mouth daily.    . ondansetron (ZOFRAN) 4 MG tablet Take 1 tablet (4 mg total) by mouth daily as needed for nausea or vomiting. 30 tablet 1  . polyethylene glycol powder (GLYCOLAX/MIRALAX) powder     . pravastatin (PRAVACHOL) 20 MG tablet Take 20 mg by mouth daily.     No current facility-administered medications for this visit.     Allergies as of 03/16/2017 - Review Complete 03/16/2017  Allergen Reaction Noted  . Hydrocodone    . Latex  01/15/2015  . Oxycodone  01/15/2015  . Sulfa antibiotics  01/15/2015    ROS:  General: Negative for anorexia, weight loss, fever, chills, fatigue, weakness. ENT: Negative for  hoarseness, difficulty swallowing , nasal congestion. CV: Negative for chest pain, angina, palpitations, dyspnea on exertion, peripheral edema.  Respiratory: Negative for dyspnea at rest, dyspnea on exertion, cough, sputum, wheezing.  GI: See history of present illness. GU:  Negative for dysuria, hematuria, urinary incontinence, urinary frequency, nocturnal urination.  Endo: Negative for unusual weight change.    Physical Examination:   BP 130/66 (BP Location: Left Arm, Patient Position: Sitting, Cuff Size: Large)   Pulse 83   Temp 98.1 F (36.7 C) (Oral)   Ht 5\' 4"  (1.626 m)   Wt 161 lb 3.2 oz (73.1 kg)   BMI 27.67 kg/m   General: Well-nourished, well-developed in no acute distress.  Eyes: No icterus. Conjunctivae pink. Mouth: Oropharyngeal mucosa moist and pink , no lesions erythema or exudate. Lungs: Clear to auscultation bilaterally. Non-labored. Heart: Regular rate and rhythm, no murmurs rubs or gallops.  Abdomen: Bowel sounds are normal, nontender, nondistended, no hepatosplenomegaly or masses, no abdominal bruits or hernia , no rebound or guarding.   Extremities: No lower extremity edema. No clubbing or deformities. Neuro: Alert and oriented x 3.  Grossly intact. Skin: Warm and dry, no jaundice.   Psych: Alert and cooperative, normal mood and affect.   Imaging Studies: Ct Abdomen Pelvis W Contrast  Result Date: 03/12/2017 CLINICAL DATA:  Pelvic pain  and urinary urgency. EXAM: CT ABDOMEN AND PELVIS WITH CONTRAST TECHNIQUE: Multidetector CT imaging of the abdomen and pelvis was performed using the standard protocol following bolus administration of intravenous contrast. CONTRAST:  75mL ISOVUE-300 IOPAMIDOL (ISOVUE-300) INJECTION 61% COMPARISON:  Complete abdomen ultrasound dated 03/11/2008. FINDINGS: Lower chest: Unremarkable. Hepatobiliary: No focal liver abnormality is seen. Status post cholecystectomy. No biliary dilatation. Pancreas: Unremarkable. No pancreatic ductal  dilatation or surrounding inflammatory changes. Spleen: Normal in size without focal abnormality. Adrenals/Urinary Tract: Mild dilatation of the right renal collecting system and ureter to the level of a 4 mm calculus in the distal ureter at the ureterovesical junction. Right perinephric soft tissue stranding poorly defined renal margins. Bilateral renal calculi. These include a 5 mm calculus in the lower pole of the right kidney, 4 mm calculus in the lower pole of the left kidney and adjacent tiny calculus. 4 mm calculus in the upper pole of the left kidney. Mild dilatation of the left renal collecting system to the level of the ureteropelvic junction with no obstructing stone visualized. Poorly distended urinary bladder. Normal appearing adrenal glands. Stomach/Bowel: Mildly dilated small bowel in the mid abdomen, extending into a small to moderate-sized right paraumbilical hernia. The stomach, colon and remainder of the small bowel are unremarkable. No evidence of appendicitis. Vascular/Lymphatic: Mild atheromatous arterial calcifications without aneurysm. No enlarged lymph nodes. Reproductive: Status post hysterectomy. No adnexal masses. Other: Small to moderate-sized right paraumbilical hernia containing mildly dilated herniated small bowel. Musculoskeletal: Lumbar and lower thoracic spine degenerative changes. IMPRESSION: 1. 4 mm distal right ureteral calculus at the ureterovesical junction, causing mild right hydronephrosis and hydroureter. 2. Small to moderate sized right paraumbilical hernia containing herniated small bowel, causing mild partial small bowel obstruction. 3. Bilateral nonobstructing renal calculi. 4. Left renal partial UPJ obstruction. Electronically Signed   By: Beckie Salts M.D.   On: 03/12/2017 11:16   Nm Myocar Multi W/spect W/wall Motion / Ef  Result Date: 03/15/2017  Normal pharmacologic myocardial perfusion stress test without ischemia or scar.  The left ventricular ejection  fraction is hyperdynamic (>65%). Wall motion is normal.  This is a low risk study.     Assessment and Plan:   Cassie Walters is a 77 y.o. y/o female here to follow up for  chest pain and constipation   Plan  1.No further episodes of chest discomfort-likely initial episode was pill related esophagitis.   2. Constipation- long standing doing well on miralax- suggest high fiber diet- she is not keen on a colonoscopy ( I did offer her yet again today  )  Dr Wyline Mood  MD,MRCP [facility]) Follow up PRN

## 2017-03-16 NOTE — Patient Instructions (Signed)
Umbilical Hernia, Adult A hernia is a bulge of tissue that pushes through an opening between muscles. An umbilical hernia happens in the abdomen, near the belly button (umbilicus). The hernia may contain tissues from the small intestine, large intestine, or fatty tissue covering the intestines (omentum). Umbilical hernias in adults tend to get worse over time, and they require surgical treatment. There are several types of umbilical hernias. You may have:  A hernia located just above or below the umbilicus (indirect hernia). This is the most common type of umbilical hernia in adults.  A hernia that forms through an opening formed by the umbilicus (direct hernia).  A hernia that comes and goes (reducible hernia). A reducible hernia may be visible only when you strain, lift something heavy, or cough. This type of hernia can be pushed back into the abdomen (reduced).  A hernia that traps abdominal tissue inside the hernia (incarcerated hernia). This type of hernia cannot be reduced.  A hernia that cuts off blood flow to the tissues inside the hernia (strangulated hernia). The tissues can start to die if this happens. This type of hernia requires emergency treatment.  What are the causes? An umbilical hernia happens when tissue inside the abdomen presses on a weak area of the abdominal muscles. What increases the risk? You may have a greater risk of this condition if you:  Are obese.  Have had several pregnancies.  Have a buildup of fluid inside your abdomen (ascites).  Have had surgery that weakens the abdominal muscles.  What are the signs or symptoms? The main symptom of this condition is a painless bulge at or near the belly button. A reducible hernia may be visible only when you strain, lift something heavy, or cough. Other symptoms may include:  Dull pain.  A feeling of pressure.  Symptoms of a strangulated hernia may include:  Pain that gets increasingly worse.  Nausea and  vomiting.  Pain when pressing on the hernia.  Skin over the hernia becoming red or purple.  Constipation.  Blood in the stool.  How is this diagnosed? This condition may be diagnosed based on:  A physical exam. You may be asked to cough or strain while standing. These actions increase the pressure inside your abdomen and force the hernia through the opening in your muscles. Your health care provider may try to reduce the hernia by pressing on it.  Your symptoms and medical history.  How is this treated? Surgery is the only treatment for an umbilical hernia. Surgery for a strangulated hernia is done as soon as possible. If you have a small hernia that is not incarcerated, you may need to lose weight before having surgery. Follow these instructions at home:  Lose weight, if told by your health care provider.  Do not try to push the hernia back in.  Watch your hernia for any changes in color or size. Tell your health care provider if any changes occur.  You may need to avoid activities that increase pressure on your hernia.  Do not lift anything that is heavier than 10 lb (4.5 kg) until your health care provider says that this is safe.  Take over-the-counter and prescription medicines only as told by your health care provider.  Keep all follow-up visits as told by your health care provider. This is important. Contact a health care provider if:  Your hernia gets larger.  Your hernia becomes painful. Get help right away if:  You develop sudden, severe pain near the   area of your hernia.  You have pain as well as nausea or vomiting.  You have pain and the skin over your hernia changes color.  You develop a fever. This information is not intended to replace advice given to you by your health care provider. Make sure you discuss any questions you have with your health care provider. Document Released: 06/26/2015 Document Revised: 09/27/2015 Document Reviewed:  06/26/2015 Elsevier Interactive Patient Education  2018 [employer]  

## 2017-06-06 ENCOUNTER — Other Ambulatory Visit: Payer: Self-pay | Admitting: Family Medicine

## 2017-06-06 DIAGNOSIS — Z1239 Encounter for other screening for malignant neoplasm of breast: Secondary | ICD-10-CM

## 2017-07-06 ENCOUNTER — Ambulatory Visit
Admission: RE | Admit: 2017-07-06 | Discharge: 2017-07-06 | Disposition: A | Discharge status: Home / Self Care | Payer: Medicare Other | Source: Ambulatory Visit | Attending: Family Medicine | Admitting: Family Medicine

## 2017-07-06 DIAGNOSIS — Z1231 Encounter for screening mammogram for malignant neoplasm of breast: Secondary | ICD-10-CM | POA: Insufficient documentation

## 2017-07-06 DIAGNOSIS — Z1239 Encounter for other screening for malignant neoplasm of breast: Secondary | ICD-10-CM

## 2018-11-28 ENCOUNTER — Other Ambulatory Visit: Payer: Self-pay | Admitting: Family Medicine

## 2018-11-28 DIAGNOSIS — Z13820 Encounter for screening for osteoporosis: Secondary | ICD-10-CM

## 2018-11-28 DIAGNOSIS — Z1231 Encounter for screening mammogram for malignant neoplasm of breast: Secondary | ICD-10-CM

## 2019-03-04 ENCOUNTER — Ambulatory Visit
Admission: RE | Admit: 2019-03-04 | Discharge: 2019-03-04 | Disposition: A | Discharge status: Home / Self Care | Payer: Medicare Other | Source: Ambulatory Visit | Attending: Family Medicine | Admitting: Family Medicine

## 2019-03-04 DIAGNOSIS — Z1231 Encounter for screening mammogram for malignant neoplasm of breast: Secondary | ICD-10-CM | POA: Insufficient documentation

## 2019-03-04 DIAGNOSIS — Z13820 Encounter for screening for osteoporosis: Secondary | ICD-10-CM

## 2019-03-04 DIAGNOSIS — Z780 Asymptomatic menopausal state: Secondary | ICD-10-CM | POA: Insufficient documentation

## 2019-06-28 ENCOUNTER — Other Ambulatory Visit: Payer: Self-pay | Admitting: Family Medicine

## 2019-06-28 DIAGNOSIS — R1032 Left lower quadrant pain: Secondary | ICD-10-CM

## 2020-03-25 ENCOUNTER — Other Ambulatory Visit: Payer: Self-pay | Admitting: Family Medicine

## 2020-03-25 DIAGNOSIS — Z1231 Encounter for screening mammogram for malignant neoplasm of breast: Secondary | ICD-10-CM

## 2020-05-01 ENCOUNTER — Other Ambulatory Visit: Payer: Self-pay

## 2020-05-01 ENCOUNTER — Ambulatory Visit
Admission: RE | Admit: 2020-05-01 | Discharge: 2020-05-01 | Disposition: A | Discharge status: Home / Self Care | Payer: Medicare Other | Source: Ambulatory Visit | Attending: Family Medicine | Admitting: Family Medicine

## 2020-05-01 DIAGNOSIS — Z1231 Encounter for screening mammogram for malignant neoplasm of breast: Secondary | ICD-10-CM | POA: Insufficient documentation

## 2020-08-14 ENCOUNTER — Emergency Department: Payer: Medicare Other

## 2020-08-14 ENCOUNTER — Other Ambulatory Visit: Payer: Self-pay

## 2020-08-14 ENCOUNTER — Emergency Department
Admission: EM | Admit: 2020-08-14 | Discharge: 2020-08-14 | Disposition: A | Discharge status: Home / Self Care | Payer: Medicare Other | Attending: Student in an Organized Health Care Education/Training Program | Admitting: Student in an Organized Health Care Education/Training Program

## 2020-08-14 DIAGNOSIS — Z79899 Other long term (current) drug therapy: Secondary | ICD-10-CM | POA: Insufficient documentation

## 2020-08-14 DIAGNOSIS — M542 Cervicalgia: Secondary | ICD-10-CM | POA: Insufficient documentation

## 2020-08-14 DIAGNOSIS — Z91040 Latex allergy status: Secondary | ICD-10-CM | POA: Insufficient documentation

## 2020-08-14 DIAGNOSIS — I10 Essential (primary) hypertension: Secondary | ICD-10-CM | POA: Insufficient documentation

## 2020-08-14 DIAGNOSIS — R42 Dizziness and giddiness: Secondary | ICD-10-CM | POA: Diagnosis present

## 2020-08-14 DIAGNOSIS — H6993 Unspecified Eustachian tube disorder, bilateral: Secondary | ICD-10-CM | POA: Insufficient documentation

## 2020-08-14 DIAGNOSIS — H6983 Other specified disorders of Eustachian tube, bilateral: Secondary | ICD-10-CM

## 2020-08-14 MED ORDER — PSEUDOEPHEDRINE HCL 30 MG PO TABS: 30.0000 mg | ORAL_TABLET | Freq: Three times a day (TID) | ORAL | 2 refills | Status: AC | PRN

## 2020-08-14 NOTE — ED Notes (Addendum)
See triage note  Presents with "swimmy "headed   States she has had some problems fluid in ears in past   Now having some ear pain and the pain moves into neck   No fever or cough

## 2020-08-14 NOTE — ED Triage Notes (Signed)
Pt states she has had problems with her ears with flid in them for the past year, and since mid June having right sided neck pain off and on, states she thinks she slept on it wrong but just wants to make sure

## 2020-08-14 NOTE — Discharge Instructions (Addendum)
No acute findings on CT of the head.  Read and follow discharge care instructions.  Take medication as needed.

## 2020-08-14 NOTE — ED Provider Notes (Signed)
[facility] Emergency Department Provider Note   ____________________________________________   Event Date/Time   First MD Initiated Contact with Patient 08/14/20 1102     (approximate)  I have reviewed the triage vital signs and the nursing notes.   HISTORY  Chief Complaint Neck Pain    HPI Cassie Walters is a 80 y.o. female patient presents with vertigo, ear pain, and right-sided neck pain for 2 weeks.  Denies LOC.  Denies weakness.  No provocative incident for complaint.  Denies radicular component to neck pain.  Rates pain as a 5/10.  Described pain as "achy".  No palliative measure for complaint.         Past Medical History:  Diagnosis Date   Closed fracture of phalanx of foot 01/25/2017   High cholesterol    Hypertension    Low bone density     Patient Active Problem List   Diagnosis Date Noted   Closed fracture of phalanx of foot 01/25/2017    Past Surgical History:  Procedure Laterality Date   ABDOMINAL HYSTERECTOMY     BUNIONECTOMY Bilateral    CHOLECYSTECTOMY      Prior to Admission medications   Medication Sig Start Date End Date Taking? Authorizing Provider  pseudoephedrine (SUDAFED) 30 MG tablet Take 1 tablet (30 mg total) by mouth every 8 (eight) hours as needed for congestion. 08/14/20 08/14/21 Yes Joni Reining, PA-C  alendronate (FOSAMAX) 70 MG tablet Take 70 mg by mouth once a week.  12/04/14   [provider]  amLODipine (NORVASC) 5 MG tablet Take 5 mg by mouth daily.    [provider]  cetirizine (ZYRTEC) 10 MG tablet Take 10 mg by mouth daily.    [provider]  clotrimazole (LOTRIMIN) 1 % cream  01/13/17   [provider]  diclofenac (VOLTAREN) 50 MG EC tablet diclofenac sodium 50 mg tablet,delayed release    [provider]  lactulose (CHRONULAC) 10 GM/15ML solution Take 45 mLs (30 g total) by mouth daily as needed for mild constipation. 01/15/15   Rockne Menghini,  MD  losartan-hydrochlorothiazide (HYZAAR) 50-12.5 MG tablet Take 1 tablet by mouth daily.    [provider]  polyethylene glycol powder (GLYCOLAX/MIRALAX) powder  01/11/17   [provider]  pravastatin (PRAVACHOL) 20 MG tablet Take 20 mg by mouth daily.    [provider]    Allergies Hydrocodone, Latex, Oxycodone, and Sulfa antibiotics  Family History  Problem Relation Age of Onset   Hypertension Mother    Dementia Mother    Hypertension Father    Lung cancer Brother    Lung cancer Brother     Social History Social History   Tobacco Use   Smoking status: Never   Smokeless tobacco: Never  Vaping Use   Vaping Use: Never used  Substance Use Topics   Alcohol use: No   Drug use: No    Review of Systems  Constitutional: No fever/chills Eyes: No visual changes. ENT: No sore throat. Cardiovascular: Denies chest pain. Respiratory: Denies shortness of breath. Gastrointestinal: No abdominal pain.  No nausea, no vomiting.  No diarrhea.  No constipation. Genitourinary: Negative for dysuria. Musculoskeletal: Negative for back pain. Skin: Negative for rash. Neurological: Positive for headaches, but denies focal weakness or numbness.  Vertigo. Endocrine: Hyperlipidemia hypertension Allergic/Immunilogical: Hydrocodone, latex, oxycodone, and sulfur antibiotics. ____________________________________________   PHYSICAL EXAM:  VITAL SIGNS: ED Triage Vitals  Enc Vitals Group     BP 08/14/20 1034 (!) 149/78  Pulse Rate 08/14/20 1034 85     Resp 08/14/20 1034 16     Temp 08/14/20 1034 98.2 F (36.8 C)     Temp Source 08/14/20 1034 Oral     SpO2 08/14/20 1034 100 %     Weight 08/14/20 1033 150 lb (68 kg)     Height 08/14/20 1033 5\' 4"  (1.626 m)     Head Circumference --      Peak Flow --      Pain Score 08/14/20 1031 5     Pain Loc --      Pain Edu? --      Excl. in GC? --     Constitutional: Alert and oriented. Well appearing and in no  acute distress. Eyes: Conjunctivae are normal. PERRL. EOMI. Head: Atraumatic. Nose: No congestion/rhinnorhea. EARS: Bilateral edematous but not erythematous TMs. Mouth/Throat: Mucous membranes are moist.  Oropharynx non-erythematous. Neck: No stridor.  No cervical spine tenderness to palpation. Hematological/Lymphatic/Immunilogical: No cervical lymphadenopathy. Cardiovascular: Normal rate, regular rhythm. Grossly normal heart sounds.  Good peripheral circulation. Respiratory: Normal respiratory effort.  No retractions. Lungs CTAB. Gastrointestinal: Soft and nontender. No distention. No abdominal bruits. No CVA tenderness. Musculoskeletal: No lower extremity tenderness nor edema.  No joint effusions. Neurologic:  Normal speech and language. No gross focal neurologic deficits are appreciated. No gait instability. Skin:  Skin is warm, dry and intact. No rash noted. Psychiatric: Mood and affect are normal. Speech and behavior are normal.  ____________________________________________   LABS (all labs ordered are listed, but only abnormal results are displayed)  Labs Reviewed - No data to display ____________________________________________  EKG  ____________________________________________  RADIOLOGY I, 10/15/20, personally viewed and evaluated these images (plain radiographs) as part of my medical decision making, as well as reviewing the written report by the radiologist.  ED MD interpretation:    Official radiology report(s): CT Head Wo Contrast  Result Date: 08/14/2020 CLINICAL DATA:  Headache EXAM: CT HEAD WITHOUT CONTRAST TECHNIQUE: Contiguous axial images were obtained from the base of the skull through the vertex without intravenous contrast. COMPARISON:  None. FINDINGS: Brain: There is no acute intracranial hemorrhage, mass effect, or edema. Gray-white differentiation is preserved. There is no extra-axial fluid collection. Ventricles and sulci are within normal limits in  size and configuration. Vascular: There is atherosclerotic calcification at the skull base. Skull: Calvarium is unremarkable. Sinuses/Orbits: No acute finding. Other: Minimal right mastoid opacification. IMPRESSION: No acute or significant intracranial abnormality. Electronically Signed   By: 10/15/2020 M.D.   On: 08/14/2020 11:50    ____________________________________________   PROCEDURES  Procedure(s) performed (including Critical Care):  Procedures   ____________________________________________   INITIAL IMPRESSION / ASSESSMENT AND PLAN / ED COURSE  As part of my medical decision making, I reviewed the following data within the electronic MEDICAL RECORD NUMBER         Patient presents with intermittent vertigo and right-sided neck pain.  Discussed no acute findings on CT of the head.  Patient complaining physical exam consistent with eustachian tube dysfunction and mild benign vertigo.  Patient given discharge care instructions advised take medication as directed.  Patient advised follow-up PCP.      ____________________________________________   FINAL CLINICAL IMPRESSION(S) / ED DIAGNOSES  Final diagnoses:  Eustachian tube dysfunction, bilateral     ED Discharge Orders          Ordered    pseudoephedrine (SUDAFED) 30 MG tablet  Every 8 hours PRN  08/14/20 1205             Note:  This document was prepared using Dragon voice recognition software and may include unintentional dictation errors.    Joni Reining, PA-C 08/14/20 1210    Willy Eddy, MD 08/14/20 [phone]

## 2021-05-28 ENCOUNTER — Other Ambulatory Visit
Admission: RE | Admit: 2021-05-28 | Discharge: 2021-05-28 | Disposition: A | Discharge status: Home / Self Care | Payer: Medicare Other | Source: Ambulatory Visit | Attending: Surgery | Admitting: Surgery

## 2021-05-28 ENCOUNTER — Encounter: Payer: Self-pay | Admitting: Surgery

## 2021-05-28 ENCOUNTER — Ambulatory Visit: Payer: Medicare Other | Admitting: Surgery

## 2021-05-28 VITALS — BP 160/75 | HR 86 | Temp 97.9°F | Ht 64.0 in | Wt 145.2 lb

## 2021-05-28 DIAGNOSIS — R1032 Left lower quadrant pain: Secondary | ICD-10-CM | POA: Diagnosis not present

## 2021-05-28 DIAGNOSIS — K429 Umbilical hernia without obstruction or gangrene: Secondary | ICD-10-CM | POA: Diagnosis not present

## 2021-05-28 DIAGNOSIS — R109 Unspecified abdominal pain: Secondary | ICD-10-CM | POA: Insufficient documentation

## 2021-05-28 LAB — COMPREHENSIVE METABOLIC PANEL
ALT: 25 U/L (ref 0–44)
AST: 29 U/L (ref 15–41)
Albumin: 4.2 g/dL (ref 3.5–5.0)
Alkaline Phosphatase: 53 U/L (ref 38–126)
Anion gap: 7 (ref 5–15)
BUN: 15 mg/dL (ref 8–23)
CO2: 26 mmol/L (ref 22–32)
Calcium: 10.4 mg/dL — ABNORMAL HIGH (ref 8.9–10.3)
Chloride: 106 mmol/L (ref 98–111)
Creatinine, Ser: 0.83 mg/dL (ref 0.44–1.00)
GFR, Estimated: >60 mL/min (ref >60)
Glucose, Bld: 96 mg/dL (ref 70–99)
Potassium: 3.8 mmol/L (ref 3.5–5.1)
Sodium: 139 mmol/L (ref 135–145)
Total Bilirubin: 0.8 mg/dL (ref 0.3–1.2)
Total Protein: 7.3 g/dL (ref 6.5–8.1)

## 2021-05-28 NOTE — Progress Notes (Signed)
?05/28/2021 ? ?Reason for Visit: Left lower quadrant/groin pain ? ?History of Present Illness: ?Cassie Walters is a 81 y.o. female presenting for evaluation of left lower quadrant/groin pain.  The patient was last seen a little bit over 4 years ago by Dr. Excell Seltzer in 2019 after visiting the emergency department for right flank abdominal pain.  She had a CT scan at that point which showed an umbilical hernia measuring about 3.5 cm.  At the time, patient declined surgery since the hernia itself has been asymptomatic.  She presents today because and more recently she has been having discomfort in the left lower quadrant/groin area which at times has been radiating towards the middle of her abdomen and sometimes to the right side.  Sometimes also discomfort going towards the left thigh.  Reports that this is happening more as she is doing more physical activity and does not happen when she is just lying down.  Denies any fevers, chills, chest pain, shortness of breath, nausea, vomiting, constipation, diarrhea.  Denies any worsening discomfort when she is having a bowel movement.  Denies any specific bulging but just reports some discomfort in that area. ? ?Past Medical History: ?Past Medical History:  ?Diagnosis Date  ? Closed fracture of phalanx of foot 01/25/2017  ? High cholesterol   ? Hypertension   ? Low bone density   ?  ? ?Past Surgical History: ?Past Surgical History:  ?Procedure Laterality Date  ? ABDOMINAL HYSTERECTOMY    ? BUNIONECTOMY Bilateral   ? CHOLECYSTECTOMY    ? ? ?Home Medications: ?Prior to Admission medications   ?Medication Sig Start Date End Date Taking? Authorizing Provider  ?alendronate (FOSAMAX) 70 MG tablet Take 70 mg by mouth once a week.  12/04/14  Yes [provider]  ?amLODipine (NORVASC) 5 MG tablet Take 5 mg by mouth daily.   Yes [provider]  ?cetirizine (ZYRTEC) 10 MG tablet Take 10 mg by mouth daily.   Yes [provider]  ?cholecalciferol (VITAMIN D) 25  MCG (1000 UNIT) tablet Take 1,000 Units by mouth daily. 03/30/21  Yes [provider]  ?clotrimazole (LOTRIMIN) 1 % cream  01/13/17  Yes [provider]  ?diclofenac (VOLTAREN) 50 MG EC tablet diclofenac sodium 50 mg tablet,delayed release   Yes [provider]  ?fluticasone (FLONASE) 50 MCG/ACT nasal spray Place into both nostrils. 02/04/21  Yes [provider]  ?lactulose (CHRONULAC) 10 GM/15ML solution Take 45 mLs (30 g total) by mouth daily as needed for mild constipation. 01/15/15  Yes Rockne Menghini, MD  ?losartan-hydrochlorothiazide (HYZAAR) 50-12.5 MG tablet Take 1 tablet by mouth daily.   Yes [provider]  ?omeprazole (PRILOSEC) 20 MG capsule Take 20 mg by mouth daily. 04/02/21  Yes [provider]  ?polyethylene glycol powder (GLYCOLAX/MIRALAX) powder  01/11/17  Yes [provider]  ?pravastatin (PRAVACHOL) 20 MG tablet Take 20 mg by mouth daily.   Yes [provider]  ?pseudoephedrine (SUDAFED) 30 MG tablet Take 1 tablet (30 mg total) by mouth every 8 (eight) hours as needed for congestion. 08/14/20 08/14/21 Yes Joni Reining, PA-C  ?tiZANidine (ZANAFLEX) 2 MG tablet Take by mouth every 6 (six) hours as needed for muscle spasms.   Yes [provider]  ?Turmeric (QC TUMERIC COMPLEX PO) Take by mouth.   Yes [provider]  ? ? ?Allergies: ?Allergies  ?Allergen Reactions  ? Hydrocodone   ? Latex   ? Oxycodone   ? Sulfa Antibiotics   ? ? ?Social  History: ? reports that she has never smoked. She has never used smokeless tobacco. She reports that she does not drink alcohol and does not use drugs.  ? ?Family History: ?Family History  ?Problem Relation Age of Onset  ? Hypertension Mother   ? Dementia Mother   ? Hypertension Father   ? Lung cancer Brother   ? Lung cancer Brother   ? ? ?Review of Systems: ?Review of Systems  ?Constitutional:  Negative for chills and fever.  ?HENT:  Positive for congestion. Negative for  hearing loss.   ?Respiratory:  Negative for shortness of breath.   ?Cardiovascular:  Negative for chest pain.  ?Gastrointestinal:  Positive for abdominal pain. Negative for constipation, diarrhea, nausea and vomiting.  ?Genitourinary:  Negative for dysuria.  ?Musculoskeletal:  Negative for myalgias.  ?Skin:  Negative for rash.  ?Neurological:  Negative for dizziness.  ?Psychiatric/Behavioral:  Negative for depression.   ? ?Physical Exam ?BP (!) 160/75   Pulse 86   Temp 97.9 ?F (36.6 ?C) (Oral)   Ht 5\' 4"  (1.626 m)   Wt 145 lb 3.2 oz (65.9 kg)   SpO2 99%   BMI 24.92 kg/m?  ?CONSTITUTIONAL: No acute distress, well-nourished ?HEENT:  Normocephalic, atraumatic, extraocular motion intact. ?NECK: Trachea is midline, and there is no jugular venous distension.  ?RESPIRATORY:  Lungs are clear, and breath sounds are equal bilaterally. Normal respiratory effort without pathologic use of accessory muscles. ?CARDIOVASCULAR: Heart is regular without murmurs, gallops, or rubs. ?GI: The abdomen is soft, nondistended, with mild tenderness to palpation in the left lower quadrant just above the left groin area.  On exam, I am unable to palpate a hernia in the left inguinal region.  Her midline incision does not appear to have other hernia defects and her umbilical hernia defect appears stable and easily reducible.  No palpable hernia in the right groin either.  ?MUSCULOSKELETAL:  Normal muscle strength and tone in all four extremities.  No peripheral edema or cyanosis. ?SKIN: Skin turgor is normal. There are no pathologic skin lesions.  ?NEUROLOGIC:  Motor and sensation is grossly normal.  Cranial nerves are grossly intact. ?PSYCH:  Alert and oriented to person, place and time. Affect is normal. ? ?Laboratory Analysis: ?No recent labs on file. ? ?Imaging: ?CT scan abdomen/pelvis on 03/12/2017: ?IMPRESSION: ?1. 4 mm distal right ureteral calculus at the ureterovesical ?junction, causing mild right hydronephrosis and  hydroureter. ?2. Small to moderate sized right paraumbilical hernia containing ?herniated small bowel, causing mild partial small bowel obstruction. ?3. Bilateral nonobstructing renal calculi. ?4. Left renal partial UPJ obstruction. ? ?Assessment and Plan: ?This is a 81 y.o. female with a known umbilical hernia and now with some left lower quadrant and left groin pain. ? ?- Discussed with the patient that on exam, her umbilical hernia seems stable in size and is very easily reducible and should not be the source of her pain in the left lower quadrant.  On exam also am unable to palpate a true hernia defect when she is coughing or straining.  Discussed with her that potentially this could have been a muscle injury or strain given that she is doing more physical activity recently.  However out of precaution, I discussed with her the use of a CT scan again to reevaluate this area.  She is in agreement. ?- For now, discussed with her taking it easy with her activity in order to rest this area and prevent further worsening.  Based on the CT scan results, I  will call her with the results of the role negative we will set up an appointment to follow-up with me after to discuss potential surgical needs. ?- All of her questions have been answered. ? ?I spent 45 minutes dedicated to the care of this patient on the date of this encounter to include pre-visit review of records, face-to-face time with the patient discussing diagnosis and management, and any post-visit coordination of care. ? ? ?Howie Ill, MD ?[facility] ? ?  ?

## 2021-05-28 NOTE — Patient Instructions (Addendum)
Your CT is scheduled for Monday 06/07/2021 at 3:00 pm (arrive by 2:30 pm) @ [facility]. Nothing to eat or drink 4 hours prior. Please pick up the contrast between now and the day before (Monday-Friday). ? ?Please go to [facility] to have labs drawn. You do not need an appointment.  ? ? ?If you have any concerns or questions, please feel free to call our office. See follow up appointment below.  ? ?Umbilical Hernia, Adult ? ?A hernia is a bulge of tissue that pushes through an opening between muscles. An umbilical hernia happens in the abdomen, near the belly button (umbilicus). The hernia may contain tissues from the small intestine, large intestine, or fatty tissue covering the intestines. Umbilical hernias in adults tend to get worse over time, and they require surgical treatment. ?There are different types of umbilical hernias, including: ?Indirect hernia. This type is located just above or below the umbilicus. It is the most common type of umbilical hernia in adults. ?Direct hernia. This type forms through an opening formed by the umbilicus. ?Reducible hernia. This type of hernia comes and goes. It may be visible only when you strain, lift something heavy, or cough. This type of hernia can be pushed back into the abdomen (reduced). ?Incarcerated hernia. This type traps abdominal tissue inside the hernia. This type of hernia cannot be reduced. ?Strangulated hernia. This type of hernia cuts off blood flow to the tissues inside the hernia. The tissues can start to die if this happens. This type of hernia requires emergency treatment. ?What are the causes? ?An umbilical hernia happens when tissue inside the abdomen presses on a weak area of the abdominal muscles. ?What increases the risk? ?You may have a greater risk of this condition if you: ?Are obese. ?Have had several pregnancies. ?Have a buildup of fluid inside your abdomen. ?Have had surgery that weakens the abdominal muscles. ?What are the signs or  symptoms? ?The main symptom of this condition is a painless bulge at or near the belly button. ?A reducible hernia may be visible only when you strain, lift something heavy, or cough. Other symptoms may include: ?Dull pain. ?A feeling of pressure. ?Symptoms of a strangulated hernia may include: ?Pain that gets increasingly worse. ?Nausea and vomiting. ?Pain when pressing on the hernia. ?Skin over the hernia becoming red or purple. ?Constipation. ?Blood in the stool. ?How is this diagnosed? ?This condition may be diagnosed based on: ?A physical exam. You may be asked to cough or strain while standing. These actions increase the pressure inside your abdomen and can force the hernia through the opening in your muscles. Your health care provider may try to reduce the hernia by pressing on it. ?Your symptoms and medical history. ?How is this treated? ?Surgery is the only treatment for an umbilical hernia. Surgery for a strangulated hernia is done as soon as possible. If you have a small hernia that is not incarcerated, you may need to lose weight before having surgery. ?Follow these instructions at home: ?Lose weight, if told by your health care provider. ?Do not try to push the hernia back in. ?Watch your hernia for any changes in color or size. Tell your health care provider if any changes occur. ?You may need to avoid activities that increase pressure on your hernia. ?Do not lift anything that is heavier than 10 lb (4.5 kg), or the limit that you are told, until your health care provider says that it is safe. ?Take over-the-counter and prescription medicines only  as told by your health care provider. ?Keep all follow-up visits. This is important. ?Contact a health care provider if: ?Your hernia gets larger. ?Your hernia becomes painful. ?Get help right away if: ?You develop sudden, severe pain near the area of your hernia. ?You have pain as well as nausea or vomiting. ?You have pain and the skin over your hernia  changes color. ?You develop a fever or chills. ?Summary ?A hernia is a bulge of tissue that pushes through an opening between muscles. An umbilical hernia happens near the belly button. ?Surgery is the only treatment for an umbilical hernia. ?Do not try to push your hernia back in. ?Keep all follow-up visits. This is important. ?This information is not intended to replace advice given to you by your health care provider. Make sure you discuss any questions you have with your health care provider. ?Document Revised: 09/02/2019 Document Reviewed: 09/02/2019 ?Elsevier Patient Education ? [place]. ? ?

## 2021-06-07 ENCOUNTER — Ambulatory Visit
Admission: RE | Admit: 2021-06-07 | Discharge: 2021-06-07 | Disposition: A | Discharge status: Home / Self Care | Payer: Medicare Other | Source: Ambulatory Visit | Attending: Surgery | Admitting: Surgery

## 2021-06-07 DIAGNOSIS — R1032 Left lower quadrant pain: Secondary | ICD-10-CM | POA: Diagnosis not present

## 2021-06-07 MED ORDER — IOHEXOL 300 MG/ML  SOLN
100.0000 mL | Freq: Once | INTRAMUSCULAR | Status: AC | PRN
  Administered 2021-06-07: 100 mL via INTRAVENOUS

## 2021-06-16 ENCOUNTER — Ambulatory Visit (INDEPENDENT_AMBULATORY_CARE_PROVIDER_SITE_OTHER): Payer: Medicare Other | Admitting: Surgery

## 2021-06-16 ENCOUNTER — Encounter: Payer: Self-pay | Admitting: Surgery

## 2021-06-16 VITALS — BP 160/77 | HR 88 | Temp 98.2°F | Ht 64.0 in | Wt 147.0 lb

## 2021-06-16 DIAGNOSIS — K429 Umbilical hernia without obstruction or gangrene: Secondary | ICD-10-CM | POA: Diagnosis not present

## 2021-06-16 NOTE — Progress Notes (Signed)
?06/16/2021 ? ?History of Present Illness: ?Cassie Walters is a 81 y.o. female presenting for follow up of left lower quadrant/groin pain.  She was last seen on 05/28/21 for this.  She reported then that she had been having more discomfort in the left lower quadrant/groin area which at times radiated towards her left thigh or to the middle of her abdomen or her back.  This would happen with more activity level.  Since then, she reports that now her pain is almost resolved and denies any worsening left sided pain.  She also had a CT scan of abdomen/pelvis on 06/07/21 which showed her known umbilical hernia which appeared stable and left nephrolithiasis without ureteral stones or hydronephrosis.   ? ?Today, she reports again that the pain has improved and denies any current issues today on the left side.  She denies any left flank pain or problems with her umbilical hernia. ? ?Past Medical History: ?Past Medical History:  ?Diagnosis Date  ? Closed fracture of phalanx of foot 01/25/2017  ? High cholesterol   ? Hypertension   ? Low bone density   ?  ? ?Past Surgical History: ?Past Surgical History:  ?Procedure Laterality Date  ? ABDOMINAL HYSTERECTOMY    ? BUNIONECTOMY Bilateral   ? CHOLECYSTECTOMY    ? ? ?Home Medications: ?Prior to Admission medications   ?Medication Sig Start Date End Date Taking? Authorizing Provider  ?alendronate (FOSAMAX) 70 MG tablet Take 70 mg by mouth once a week.  12/04/14  Yes [provider]  ?amLODipine (NORVASC) 5 MG tablet Take 5 mg by mouth daily.   Yes [provider]  ?cetirizine (ZYRTEC) 10 MG tablet Take 10 mg by mouth daily.   Yes [provider]  ?cholecalciferol (VITAMIN D) 25 MCG (1000 UNIT) tablet Take 1,000 Units by mouth daily. 03/30/21  Yes [provider]  ?clotrimazole (LOTRIMIN) 1 % cream  01/13/17  Yes [provider]  ?diclofenac (VOLTAREN) 50 MG EC tablet diclofenac sodium 50 mg tablet,delayed release   Yes [provider]  ?fluticasone (FLONASE) 50 MCG/ACT nasal spray Place into both nostrils. 02/04/21  Yes [provider]  ?lactulose (CHRONULAC) 10 GM/15ML solution Take 45 mLs (30 g total) by mouth daily as needed for mild constipation. 01/15/15  Yes Eula Listen, MD  ?losartan-hydrochlorothiazide (HYZAAR) 50-12.5 MG tablet Take 1 tablet by mouth daily.   Yes [provider]  ?omeprazole (PRILOSEC) 20 MG capsule Take 20 mg by mouth daily. 04/02/21  Yes [provider]  ?polyethylene glycol powder (GLYCOLAX/MIRALAX) powder  01/11/17  Yes [provider]  ?pravastatin (PRAVACHOL) 20 MG tablet Take 20 mg by mouth daily.   Yes [provider]  ?pseudoephedrine (SUDAFED) 30 MG tablet Take 1 tablet (30 mg total) by mouth every 8 (eight) hours as needed for congestion. 08/14/20 08/14/21 Yes Sable Feil, PA-C  ?tiZANidine (ZANAFLEX) 2 MG tablet Take by mouth every 6 (six) hours as needed for muscle spasms.   Yes [provider]  ?Turmeric (QC TUMERIC COMPLEX PO) Take by mouth.   Yes [provider]  ? ? ?Allergies: ?Allergies  ?Allergen Reactions  ? Hydrocodone   ? Latex   ? Oxycodone   ? Sulfa Antibiotics   ? ? ?Review of Systems: ?Review of Systems  ?Constitutional:  Negative for chills and fever.  ?Respiratory:  Negative for shortness of breath.   ?Cardiovascular:  Negative for chest pain.  ?Gastrointestinal:  Negative for abdominal pain, nausea and vomiting.  ?Skin:  Negative for rash.  ? ?Physical Exam ?BP (!) 160/77   Pulse 88   Temp 98.2 ?F (36.8 ?C)   Ht 5\' 4"  (1.626 m)   Wt 147 lb (66.7 kg)   SpO2 100%   BMI 25.23 kg/m?  ?CONSTITUTIONAL: No acute distress, well nourished. ?HEENT:  Normocephalic, atraumatic, extraocular motion intact. ?RESPIRATORY:  Normal respiratory effort without pathologic use of accessory muscles. ?CARDIOVASCULAR: Regular rhythm and rate. ?GI: The abdomen is soft, non-distended, currently non-tender.  Umbilical hernia is stable,  reducible, without any significant discomfort.  No evidence again of a left inguinal hernia. ?NEUROLOGIC:  Motor and sensation is grossly normal.  Cranial nerves are grossly intact. ?PSYCH:  Alert and oriented to person, place and time. Affect is normal. ? ?Labs/Imaging: ?CT abdomen/pelvis 06/07/21: ?IMPRESSION: ?Umbilical hernia containing small bowel loops. No evidence of bowel ?obstruction. ?  ?Left nephrolithiasis. No ureteral stones or hydronephrosis. Left ?extrarenal pelvis. ?  ?Aortic atherosclerosis. ? ?Assessment and Plan: ?This is a 81 y.o. female with umbilical hernia and prior left abdominal pain ? ?--The patient's symptoms from her last visit have resolved/improved and today denies any pain.  On her CT scan, there are no findings in the left lower quadrant or groin area.  On exam today, her umbilical hernia is stable and there's no evidence of a left inguinal hernia.  Potentially, she could have had a muscular injury.  Discussed with her that we could repair her umbilical hernia as a precaution so that there's no risk of incarceration/strangulation in the future.  Discussed the possibility of doing this via robotic minimally invasive surgery.  At this point, the patient is not interested in surgery.  She will think about it further and in the meantime will continue with watchful waiting/monitoring.  However, she agrees that if she notices any further issues or changes she will let us know so we can see her again. ?--Follow up as needed. ? ?I spent 15 minutes dedicated to the care of this patient on the date of this encounter to include pre-visit review of records, face-to-face time with the patient discussing diagnosis and management, and any post-visit coordination of care. ? ? ?Melvyn Neth, MD ?[facility] ? ? ?  ?

## 2021-06-16 NOTE — Patient Instructions (Addendum)
Call us if you decide you would like to get your hernia repaired. ? ?Umbilical Hernia, Adult ? ?A hernia is a bulge of tissue that pushes through an opening between muscles. An umbilical hernia happens in the abdomen, near the belly button (umbilicus). The hernia may contain tissues from the small intestine, large intestine, or fatty tissue covering the intestines. Umbilical hernias in adults tend to get worse over time, and they require surgical treatment. ?There are different types of umbilical hernias, including: ?Indirect hernia. This type is located just above or below the umbilicus. It is the most common type of umbilical hernia in adults. ?Direct hernia. This type forms through an opening formed by the umbilicus. ?Reducible hernia. This type of hernia comes and goes. It may be visible only when you strain, lift something heavy, or cough. This type of hernia can be pushed back into the abdomen (reduced). ?Incarcerated hernia. This type traps abdominal tissue inside the hernia. This type of hernia cannot be reduced. ?Strangulated hernia. This type of hernia cuts off blood flow to the tissues inside the hernia. The tissues can start to die if this happens. This type of hernia requires emergency treatment. ?What are the causes? ?An umbilical hernia happens when tissue inside the abdomen presses on a weak area of the abdominal muscles. ?What increases the risk? ?You may have a greater risk of this condition if you: ?Are obese. ?Have had several pregnancies. ?Have a buildup of fluid inside your abdomen. ?Have had surgery that weakens the abdominal muscles. ?What are the signs or symptoms? ?The main symptom of this condition is a painless bulge at or near the belly button. ?A reducible hernia may be visible only when you strain, lift something heavy, or cough. Other symptoms may include: ?Dull pain. ?A feeling of pressure. ?Symptoms of a strangulated hernia may include: ?Pain that gets increasingly worse. ?Nausea  and vomiting. ?Pain when pressing on the hernia. ?Skin over the hernia becoming red or purple. ?Constipation. ?Blood in the stool. ?How is this diagnosed? ?This condition may be diagnosed based on: ?A physical exam. You may be asked to cough or strain while standing. These actions increase the pressure inside your abdomen and can force the hernia through the opening in your muscles. Your health care provider may try to reduce the hernia by pressing on it. ?Your symptoms and medical history. ?How is this treated? ?Surgery is the only treatment for an umbilical hernia. Surgery for a strangulated hernia is done as soon as possible. If you have a small hernia that is not incarcerated, you may need to lose weight before having surgery. ?Follow these instructions at home: ?Lose weight, if told by your health care provider. ?Do not try to push the hernia back in. ?Watch your hernia for any changes in color or size. Tell your health care provider if any changes occur. ?You may need to avoid activities that increase pressure on your hernia. ?Do not lift anything that is heavier than 10 lb (4.5 kg), or the limit that you are told, until your health care provider says that it is safe. ?Take over-the-counter and prescription medicines only as told by your health care provider. ?Keep all follow-up visits. This is important. ?Contact a health care provider if: ?Your hernia gets larger. ?Your hernia becomes painful. ?Get help right away if: ?You develop sudden, severe pain near the area of your hernia. ?You have pain as well as nausea or vomiting. ?You have pain and the skin over your hernia changes  color. ?You develop a fever or chills. ?Summary ?A hernia is a bulge of tissue that pushes through an opening between muscles. An umbilical hernia happens near the belly button. ?Surgery is the only treatment for an umbilical hernia. ?Do not try to push your hernia back in. ?Keep all follow-up visits. This is important. ?This  information is not intended to replace advice given to you by your health care provider. Make sure you discuss any questions you have with your health care provider. ?Document Revised: 09/02/2019 Document Reviewed: 09/02/2019 ?Elsevier Patient Education ? [place]. ? ?

## 2021-08-06 ENCOUNTER — Emergency Department: Payer: Medicare Other

## 2021-08-06 ENCOUNTER — Encounter: Payer: Self-pay | Admitting: Intensive Care

## 2021-08-06 ENCOUNTER — Other Ambulatory Visit: Payer: Self-pay

## 2021-08-06 ENCOUNTER — Emergency Department
Admission: EM | Admit: 2021-08-06 | Discharge: 2021-08-06 | Disposition: A | Discharge status: Home / Self Care | Payer: Medicare Other | Attending: Emergency Medicine | Admitting: Emergency Medicine

## 2021-08-06 DIAGNOSIS — R519 Headache, unspecified: Secondary | ICD-10-CM | POA: Diagnosis present

## 2021-08-06 DIAGNOSIS — Z79899 Other long term (current) drug therapy: Secondary | ICD-10-CM | POA: Insufficient documentation

## 2021-08-06 DIAGNOSIS — H109 Unspecified conjunctivitis: Secondary | ICD-10-CM | POA: Insufficient documentation

## 2021-08-06 DIAGNOSIS — I10 Essential (primary) hypertension: Secondary | ICD-10-CM | POA: Insufficient documentation

## 2021-08-06 DIAGNOSIS — H43391 Other vitreous opacities, right eye: Secondary | ICD-10-CM | POA: Diagnosis not present

## 2021-08-06 DIAGNOSIS — R42 Dizziness and giddiness: Secondary | ICD-10-CM

## 2021-08-06 LAB — PROTIME-INR
INR: 1.1 (ref 0.8–1.2)
Prothrombin Time: 13.6 seconds (ref 11.4–15.2)

## 2021-08-06 LAB — DIFFERENTIAL
Abs Immature Granulocytes: 0.02 10*3/uL (ref 0.00–0.07)
Basophils Absolute: 0 10*3/uL (ref 0.0–0.1)
Basophils Relative: 0 %
Eosinophils Absolute: 0.1 10*3/uL (ref 0.0–0.5)
Eosinophils Relative: 2 %
Immature Granulocytes: 0 %
Lymphocytes Relative: 22 %
Lymphs Abs: 1.6 10*3/uL (ref 0.7–4.0)
Monocytes Absolute: 0.4 10*3/uL (ref 0.1–1.0)
Monocytes Relative: 5 %
Neutro Abs: 4.9 10*3/uL (ref 1.7–7.7)
Neutrophils Relative %: 71 %

## 2021-08-06 LAB — COMPREHENSIVE METABOLIC PANEL
ALT: 26 U/L (ref 0–44)
AST: 28 U/L (ref 15–41)
Albumin: 4.3 g/dL (ref 3.5–5.0)
Alkaline Phosphatase: 49 U/L (ref 38–126)
Anion gap: 8 (ref 5–15)
BUN: 17 mg/dL (ref 8–23)
CO2: 25 mmol/L (ref 22–32)
Calcium: 10.3 mg/dL (ref 8.9–10.3)
Chloride: 108 mmol/L (ref 98–111)
Creatinine, Ser: 0.83 mg/dL (ref 0.44–1.00)
GFR, Estimated: >60 mL/min (ref >60)
Glucose, Bld: 104 mg/dL — ABNORMAL HIGH (ref 70–99)
Potassium: 3.5 mmol/L (ref 3.5–5.1)
Sodium: 141 mmol/L (ref 135–145)
Total Bilirubin: 0.9 mg/dL (ref 0.3–1.2)
Total Protein: 7.2 g/dL (ref 6.5–8.1)

## 2021-08-06 LAB — ETHANOL: Alcohol, Ethyl (B): <10 mg/dL (ref <10)

## 2021-08-06 LAB — CBC
HCT: 42.9 % (ref 36.0–46.0)
Hemoglobin: 14.2 g/dL (ref 12.0–15.0)
MCH: 29.4 pg (ref 26.0–34.0)
MCHC: 33.1 g/dL (ref 30.0–36.0)
MCV: 88.8 fL (ref 80.0–100.0)
Platelets: 238 10*3/uL (ref 150–400)
RBC: 4.83 MIL/uL (ref 3.87–5.11)
RDW: 13.5 % (ref 11.5–15.5)
WBC: 7 10*3/uL (ref 4.0–10.5)
nRBC: 0 % (ref 0.0–0.2)

## 2021-08-06 LAB — URINALYSIS, COMPLETE (UACMP) WITH MICROSCOPIC
Bacteria, UA: NONE SEEN
Bilirubin Urine: NEGATIVE
Glucose, UA: NEGATIVE mg/dL
Hgb urine dipstick: NEGATIVE
Ketones, ur: NEGATIVE mg/dL
Leukocytes,Ua: NEGATIVE
Nitrite: NEGATIVE
Protein, ur: NEGATIVE mg/dL
Specific Gravity, Urine: 1.016 (ref 1.005–1.030)
pH: 6 (ref 5.0–8.0)

## 2021-08-06 LAB — SEDIMENTATION RATE: Sed Rate: 2 mm/hr (ref 0–30)

## 2021-08-06 LAB — TROPONIN I (HIGH SENSITIVITY): Troponin I (High Sensitivity): 4 ng/L (ref <18)

## 2021-08-06 LAB — APTT: aPTT: 36 seconds (ref 24–36)

## 2021-08-06 MED ORDER — TETRACAINE HCL 0.5 % OP SOLN
1.0000 [drp] | Freq: Once | OPHTHALMIC | Status: AC
  Administered 2021-08-06: 1 [drp] via OPHTHALMIC
  Filled 2021-08-06: qty 4

## 2021-08-06 NOTE — ED Triage Notes (Addendum)
Patient c/o intermittent head pain, bilateral neck tightness/pain under ears, and lightheadedness. Denies sob or cp. Also reports right eye vision changes and redness that started yesterday.

## 2021-08-06 NOTE — ED Notes (Signed)
First Nurse Note: Pt to ED via POV c/o headache x 2 days

## 2021-08-06 NOTE — ED Notes (Signed)
Dc ppw provided. Questions, followup and rx information reviewed as needed. pt declines vs and provides verbal consent at this time. Pt alert and oriented to lobby. 

## 2021-08-06 NOTE — ED Provider Notes (Signed)
[facility] Provider Note    Event Date/Time   First MD Initiated Contact with Patient 08/06/21 1130     (approximate)   History   Dizziness   HPI  Cassie Walters is a 81 y.o. female with a past medical history of HTN, HDL low bone density as well as chronic floaters in the right eye who presents for evaluation of headache and some redness in the right eye.  She noticed a little bit of clear drainage.  She thinks it started about 2 days ago.  She states she does not currently have any headache today but is mostly bothering her yesterday.  Nurses mild photophobia.  She states she struggles with chronic congestion but is not any more congested today than usual.  She denies any trauma or injuries, fever, sore throat, chest pain, shortness of breath, cough, nausea, vomiting, diarrhea or rash, urinary symptoms or any focal extremity weakness numbness or tingling.  She denies any double vision or vertigo.  No current earache.    Past Medical History:  Diagnosis Date   Closed fracture of phalanx of foot 01/25/2017   High cholesterol    Hypertension    Low bone density      Physical Exam  Triage Vital Signs: ED Triage Vitals [08/06/21 0938]  Enc Vitals Group     BP (!) 162/88     Pulse Rate (!) 101     Resp 14     Temp 98.6 F (37 C)     Temp Source Oral     SpO2 100 %     Weight 147 lb (66.7 kg)     Height 5' 4"  (1.626 m)     Head Circumference      Peak Flow      Pain Score 4     Pain Loc      Pain Edu?      Excl. in [place]?     Most recent vital signs: Vitals:   08/06/21 1246 08/06/21 1425  BP: (!) 158/80 (!) 160/80  Pulse: 93 88  Resp: 16 16  Temp:    SpO2: 100% 100%    General: Awake, no distress.  CV:  Good peripheral perfusion.  2+ radial pulses. Resp:  Normal effort.  Clear bilaterally. Abd:  No distention.  Soft throughout. Other:  Visual acuity is 20/25 bilaterally.  IOP is 15, 16, and 17 in the right eye.  And only able to obtain  1 IOP reading the left eye which is 12.  PERRLA.  EOMI.  There is some mild conjunctivitis in the right eye without any significant chemosis or periorbital swelling.  TMs unremarkable laterally.  Patient is full range of motion of her neck.  Oropharynx is unremarkable.  There is no specific tenderness or any specific spot in temple area forehead or scalp.  She has symmetric strength in upper and lower extremities.  Sensation is intact light touch throughout all extremities.   ED Results / Procedures / Treatments  Labs (all labs ordered are listed, but only abnormal results are displayed) Labs Reviewed  COMPREHENSIVE METABOLIC PANEL - Abnormal; Notable for the following components:      Result Value   Glucose, Bld 104 (*)    All other components within normal limits  URINALYSIS, COMPLETE (UACMP) WITH MICROSCOPIC - Abnormal; Notable for the following components:   Color, Urine YELLOW (*)    APPearance HAZY (*)    All other components within normal limits  PROTIME-INR  APTT  CBC  DIFFERENTIAL  ETHANOL  SEDIMENTATION RATE  TROPONIN I (HIGH SENSITIVITY)  TROPONIN I (HIGH SENSITIVITY)     EKG  ECGs remarkable sinus rhythm with a ventricular rate of 95, normal axis, unremarkable intervals without clear evidence of acute ischemia or significant arrhythmia.   RADIOLOGY  CT head ordered in triage my interpretation without evidence of ischemia, edema, mass effect, hemorrhage or other clear acute process.  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   PROCEDURES:  Critical Care performed: No  Procedures    MEDICATIONS ORDERED IN ED: Medications  tetracaine (PONTOCAINE) 0.5 % ophthalmic solution 1 drop (1 drop Both Eyes Given by Other 08/06/21 1251)     IMPRESSION / MDM / [place] / ED COURSE  I reviewed the triage vital signs and the nursing notes.  Patient's presentation is most consistent with acute presentation with potential threat to life or bodily function.                               Differential diagnosis includes, but is not limited to headache such as migraine, cluster, tension headache versus secondary headache such as sinusitis, temporal arteritis, acute angle-closure glaucoma, and possibly an allergic versus bacterial conjunctivitis with very low suspicion based on her exam for mastoiditis, meningitis, CVT or pre or postseptal cellulitis.  She does report she took her blood pressure medicines this morning although slightly hypertensive.  She states she only has high blood pressures when she visits the doctor's office.  She also states that she has no headache today and her headache came on gradually yesterday and overall have a lower suspicion for an SAH.  ECGs remarkable sinus rhythm with a ventricular rate of 95, normal axis, unremarkable intervals without clear evidence of acute ischemia or significant arrhythmia.  Nonelevated troponin obtained greater than 3 hours after symptom onset is not suggestive of ACS.  CT head ordered in triage my interpretation without evidence of ischemia, edema, mass effect, hemorrhage or other clear acute process.  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  CBC without any significant lecture metabolic derangements.  Ethanol undetectable.  INR and PTT WNL.  CBC without leukocytosis or acute anemia.  UA shows no evidence of infection ketones blood or bilirubin.  ESR is to not suggestive of temporal arteritis.  Overall unclear precise etiology for patient's symptoms it is certainly possible she has cluster headache versus a allergic or viral conjunctivitis.  She states she felt a little dizzy walking but did not have any gross ataxia.  Discussed that I recommend MR brain to  completely exclude a acute stroke although she is declining this understanding the risks of a stroke including severe disability and death.  I think he has capacity refuse this at this time.  Advised her to follow-up with her PCP to have her blood pressure rechecked and with her ophthalmologist.  You can use warm compresses on her eye.  Discussed returning for any new or worsening symptoms.  We will hold off on antibiotics at this time.  Discharged in stable condition.  Strict return precautions advised and discussed.      FINAL CLINICAL IMPRESSION(S) / ED DIAGNOSES   Final diagnoses:  Dizziness  Nonintractable headache, unspecified chronicity pattern, unspecified  headache type  Hypertension, unspecified type     Rx / DC Orders   ED Discharge Orders     None        Note:  This document was prepared using Dragon voice recognition software and may include unintentional dictation errors.   Lucrezia Starch, MD 08/06/21 [phone]

## 2021-08-06 NOTE — ED Notes (Signed)
Called lab who will add on ESR and troponin.

## 2021-11-25 ENCOUNTER — Other Ambulatory Visit: Payer: Self-pay | Admitting: Family Medicine

## 2021-11-25 DIAGNOSIS — Z1231 Encounter for screening mammogram for malignant neoplasm of breast: Secondary | ICD-10-CM

## 2022-02-11 ENCOUNTER — Ambulatory Visit
Admission: RE | Admit: 2022-02-11 | Discharge: 2022-02-11 | Disposition: A | Discharge status: Home / Self Care | Payer: Medicare Other | Source: Ambulatory Visit | Attending: Family Medicine | Admitting: Family Medicine

## 2022-02-11 ENCOUNTER — Encounter: Payer: Self-pay | Admitting: Family Medicine

## 2022-02-11 DIAGNOSIS — Z1231 Encounter for screening mammogram for malignant neoplasm of breast: Secondary | ICD-10-CM

## 2022-02-15 ENCOUNTER — Other Ambulatory Visit: Payer: Self-pay | Admitting: Family Medicine

## 2022-02-15 DIAGNOSIS — R928 Other abnormal and inconclusive findings on diagnostic imaging of breast: Secondary | ICD-10-CM

## 2022-02-15 DIAGNOSIS — N6001 Solitary cyst of right breast: Secondary | ICD-10-CM

## 2022-02-25 ENCOUNTER — Emergency Department: Payer: Medicare Other

## 2022-02-25 ENCOUNTER — Encounter: Payer: Self-pay | Admitting: Emergency Medicine

## 2022-02-25 ENCOUNTER — Other Ambulatory Visit: Payer: Self-pay

## 2022-02-25 ENCOUNTER — Inpatient Hospital Stay: Admission: RE | Admit: 2022-02-25 | Payer: Medicare Other | Source: Ambulatory Visit

## 2022-02-25 ENCOUNTER — Emergency Department
Admission: EM | Admit: 2022-02-25 | Discharge: 2022-02-25 | Disposition: A | Discharge status: Home / Self Care | Payer: Medicare Other | Attending: Emergency Medicine | Admitting: Emergency Medicine

## 2022-02-25 DIAGNOSIS — I10 Essential (primary) hypertension: Secondary | ICD-10-CM | POA: Insufficient documentation

## 2022-02-25 DIAGNOSIS — M546 Pain in thoracic spine: Secondary | ICD-10-CM

## 2022-02-25 MED ORDER — LIDOCAINE 5 % EX PTCH
1.0000 | MEDICATED_PATCH | CUTANEOUS | Status: DC
  Administered 2022-02-25: 1 via TRANSDERMAL
  Filled 2022-02-25: qty 1

## 2022-02-25 MED ORDER — ACETAMINOPHEN 500 MG PO TABS
1000.0000 mg | ORAL_TABLET | Freq: Once | ORAL | Status: AC
  Administered 2022-02-25: 1000 mg via ORAL
  Filled 2022-02-25: qty 2

## 2022-02-25 MED ORDER — LIDOCAINE 5 % EX PTCH
1.0000 | MEDICATED_PATCH | CUTANEOUS | 0 refills | Status: DC
Start: 2022-02-25 — End: 2023-02-22

## 2022-02-25 NOTE — ED Triage Notes (Signed)
Pt sts that she has been having back pain since last PM. Pt sts that she has taken OTC NSAIDS without relief.

## 2022-02-25 NOTE — Discharge Instructions (Addendum)
You can take Tylenol and use a Lidoderm patch for pain.  If you develop new symptoms such as chest pain shortness of breath fever please return to the emergency department.  If your symptoms or not improving please follow-up with your primary doctor.

## 2022-02-25 NOTE — ED Provider Notes (Signed)
[facility] Provider Note    Event Date/Time   First MD Initiated Contact with Patient 02/25/22 1221     (approximate)   History   Back Pain   HPI  Cassie Walters is a 82 y.o. female past medical history hypertension hyperlipidemia who presents with thoracic back pain.  Symptoms started last night when she stood up out of the chair.  Pain is located around the right scapula and is worse when she moves around.  Feels okay when she is at rest.  Denies pain in the chest or abdomen denies dyspnea.  Denies history of similar but does have history of chronic shoulder pain and feels like she has a lot of tension in the shoulders.  Denies numbness tingling weakness in her upper or lower extremities.     Past Medical History:  Diagnosis Date   Closed fracture of phalanx of foot 01/25/2017   High cholesterol    Hypertension    Low bone density     Patient Active Problem List   Diagnosis Date Noted   Closed fracture of phalanx of foot 01/25/2017     Physical Exam  Triage Vital Signs: ED Triage Vitals  Enc Vitals Group     BP 02/25/22 1152 (!) 177/72     Pulse Rate 02/25/22 1152 95     Resp 02/25/22 1152 18     Temp 02/25/22 1152 98.6 F (37 C)     Temp Source 02/25/22 1152 Oral     SpO2 02/25/22 1152 100 %     Weight 02/25/22 1151 140 lb (63.5 kg)     Height 02/25/22 1234 5\' 4"  (1.626 m)     Head Circumference --      Peak Flow --      Pain Score 02/25/22 1151 10     Pain Loc --      Pain Edu? --      Excl. in [place]? --     Most recent vital signs: Vitals:   02/25/22 1152  BP: (!) 177/72  Pulse: 95  Resp: 18  Temp: 98.6 F (37 C)  SpO2: 100%     General: Awake, no distress.  CV:  Good peripheral perfusion.  Resp:  Normal effort.  Abd:  No distention.  Abdomen soft nontender throughout Neuro:             Awake, Alert, Oriented x 3  Other:  No midline C, T or L-spine tenderness, no significant tenderness to palpation in the paraspinal  musculature, pain is reproduced when patient stands up    ED Results / Procedures / Treatments  Labs (all labs ordered are listed, but only abnormal results are displayed) Labs Reviewed - No data to display   EKG  EKG interpretation performed by myself: NSR, nml axis, nml intervals, no acute ischemic changes    RADIOLOGY I reviewed and interpreted the CXR which does not show any acute cardiopulmonary process    PROCEDURES:  Critical Care performed: No  Procedures   MEDICATIONS ORDERED IN ED: Medications  lidocaine (LIDODERM) 5 % 1 patch (1 patch Transdermal Patch Applied 02/25/22 1352)  acetaminophen (TYLENOL) tablet 1,000 mg (1,000 mg Oral Given 02/25/22 1352)     IMPRESSION / MDM / [place] / ED COURSE  I reviewed the triage vital signs and the nursing notes.  Patient's presentation is most consistent with acute complicated illness / injury requiring diagnostic workup.  Differential diagnosis includes, but is not limited to, musculoskeletal thoracic back pain, cervical radiculopathy, referred pain from the shoulder, ACS, aortic dissection, referred pain from abdomen  Patient is an 82 year old female presents with upper back pain.  Started last night after she stood up.  Pain is intermittent and seems to be worse with movement it is located around the right scapula.  Denies pain in the chest dyspnea or abdominal pain fevers or chills.  Vital signs are notable for hypertension but are otherwise reassuring.  On exam she looks well abdomen is soft nontender there is no midline C, T or L-spine tenderness.  Pain is not really reproducible with palpation but is reproduced by patient standing up and rotating right to left.  Patient has no chest pain or other anginal symptoms and with pain reproduced by movement I think this is more likely to be musculoskeletal.  Did obtain EKG given her age and there is no obvious ischemic changes.  Also  obtain chest x-ray and x-ray of the T-spine.  There is no obvious compression fracture in the thoracic spine.  Chest x-ray does show some elevation of the left hemidiaphragm and some distention of bowel in the left upper quadrant but I think this is less likely to be the cause of her symptoms as she has no abdominal pain and no abdominal tenderness or bloating on exam.  Considered more ominous causes of upper back pain such as thoracic dissection or pulmonary embolism.  Think dissection is less likely given the intermittent nature of her symptoms and the fact that it is reproduced with movement.  She is saturating 100% not tachycardic and pain is not pleuritic so feel that PE is less likely.  Plan to prescribe Tylenol and Lidoderm patch.  Did discuss with the patient that if symptoms are worsening or she develops new symptoms such as chest pain or shortness of breath that she return to the ED.       FINAL CLINICAL IMPRESSION(S) / ED DIAGNOSES   Final diagnoses:  Acute right-sided thoracic back pain     Rx / DC Orders   ED Discharge Orders          Ordered    lidocaine (LIDODERM) 5 %  Every 24 hours        02/25/22 1452             Note:  This document was prepared using Dragon voice recognition software and may include unintentional dictation errors.   Rada Hay, MD 02/25/22 1452

## 2022-03-04 ENCOUNTER — Ambulatory Visit
Admission: RE | Admit: 2022-03-04 | Discharge: 2022-03-04 | Disposition: A | Discharge status: Home / Self Care | Payer: Medicare Other | Source: Ambulatory Visit | Attending: Family Medicine | Admitting: Family Medicine

## 2022-03-04 ENCOUNTER — Encounter: Payer: Self-pay | Admitting: Family Medicine

## 2022-03-04 DIAGNOSIS — N6001 Solitary cyst of right breast: Secondary | ICD-10-CM | POA: Insufficient documentation

## 2022-03-04 DIAGNOSIS — R928 Other abnormal and inconclusive findings on diagnostic imaging of breast: Secondary | ICD-10-CM | POA: Diagnosis present

## 2022-03-08 ENCOUNTER — Other Ambulatory Visit: Payer: Self-pay | Admitting: Family Medicine

## 2022-03-08 DIAGNOSIS — R921 Mammographic calcification found on diagnostic imaging of breast: Secondary | ICD-10-CM

## 2022-03-08 DIAGNOSIS — R928 Other abnormal and inconclusive findings on diagnostic imaging of breast: Secondary | ICD-10-CM

## 2022-03-18 IMAGING — MG MM DIGITAL SCREENING BILAT W/ TOMO AND CAD
8 series · 8 of 24 positions shown · non-contrast
Comparison: Previous exam(s).

CLINICAL DATA: Screening.

EXAM:
DIGITAL SCREENING BILATERAL MAMMOGRAM WITH TOMOSYNTHESIS AND CAD
TECHNIQUE: Bilateral screening digital craniocaudal and mediolateral oblique
mammograms were obtained. Bilateral screening digital breast
tomosynthesis was performed. The images were evaluated with
computer-aided detection.

[L MLO synth-2D]
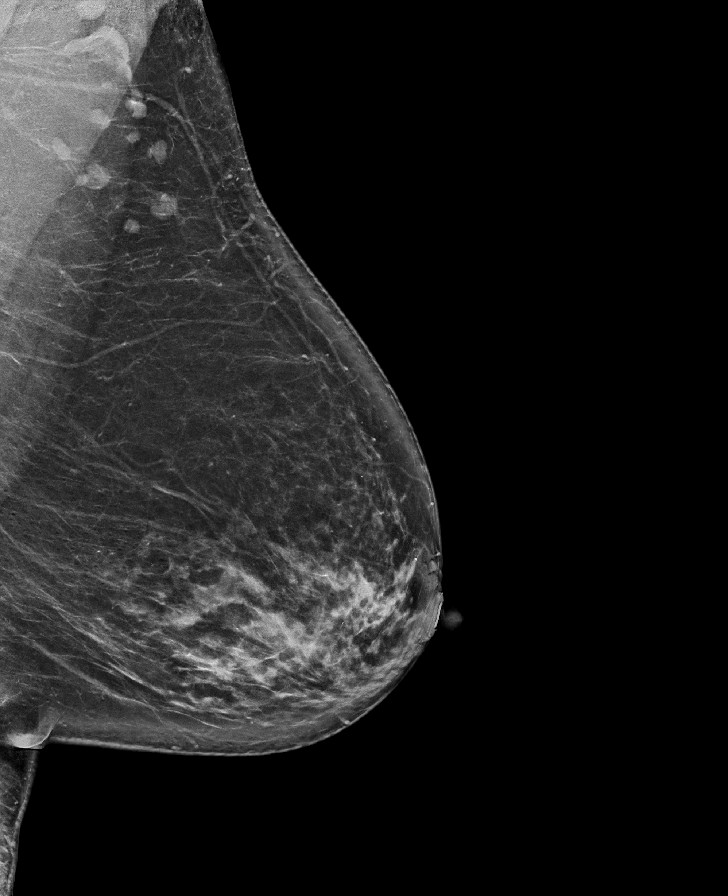

[R CC synth-2D]
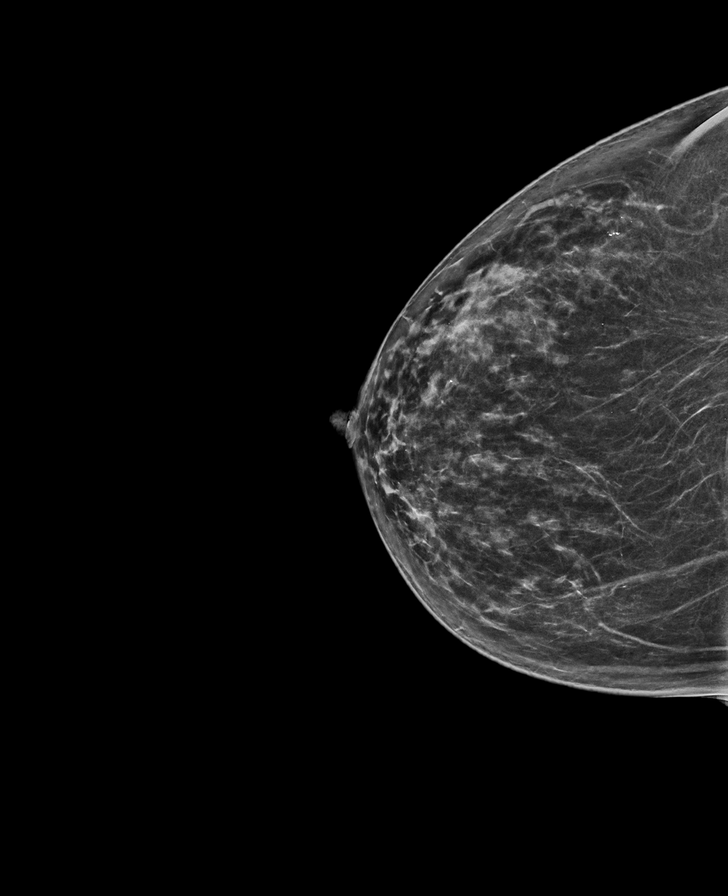

[R MLO synth-2D]
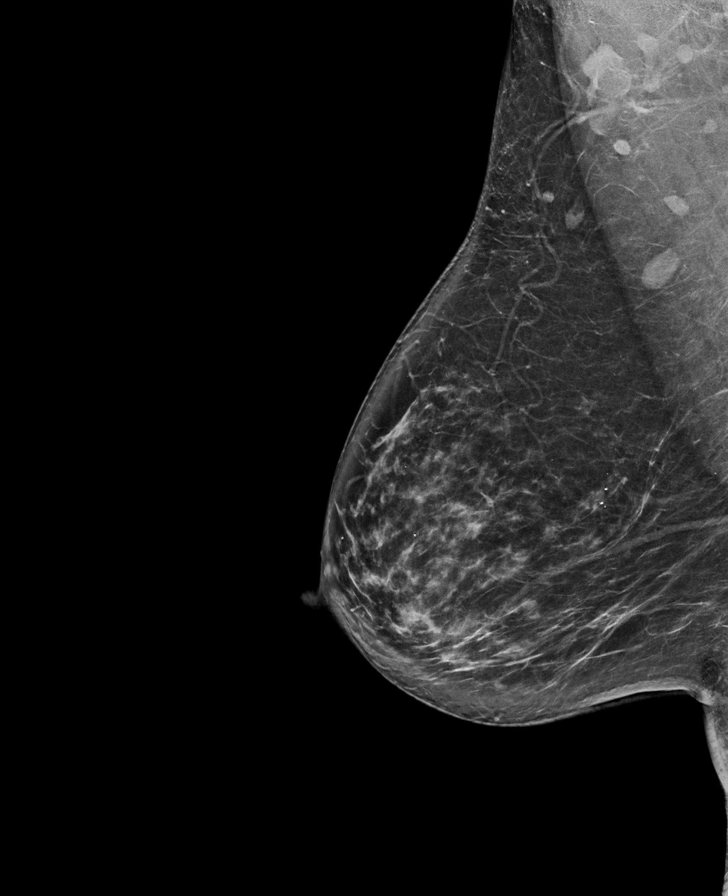

[L CC synth-2D]
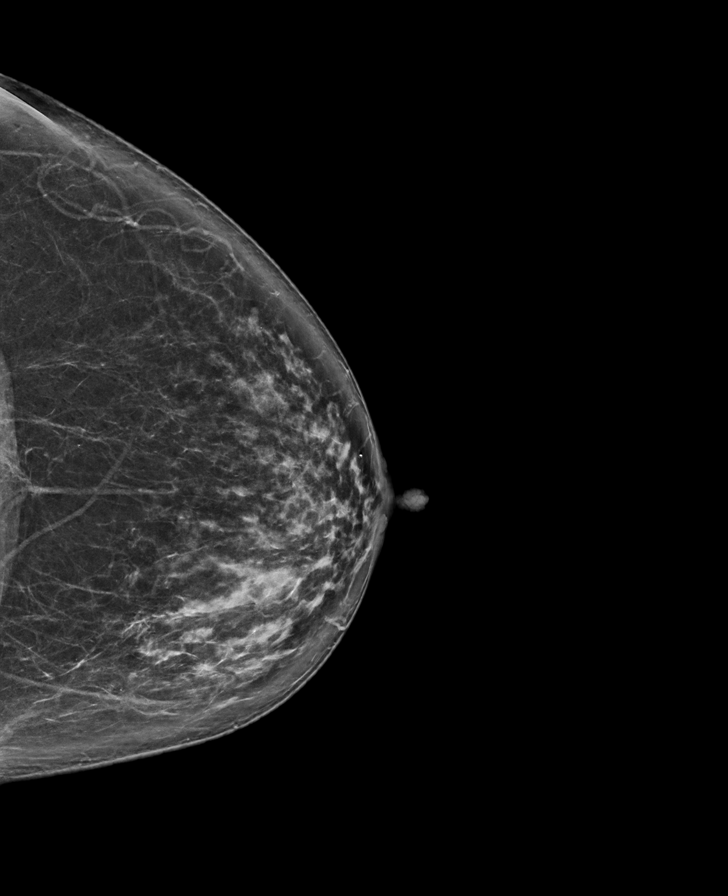

[R MLO tomo · tomo slice 43/85.0]
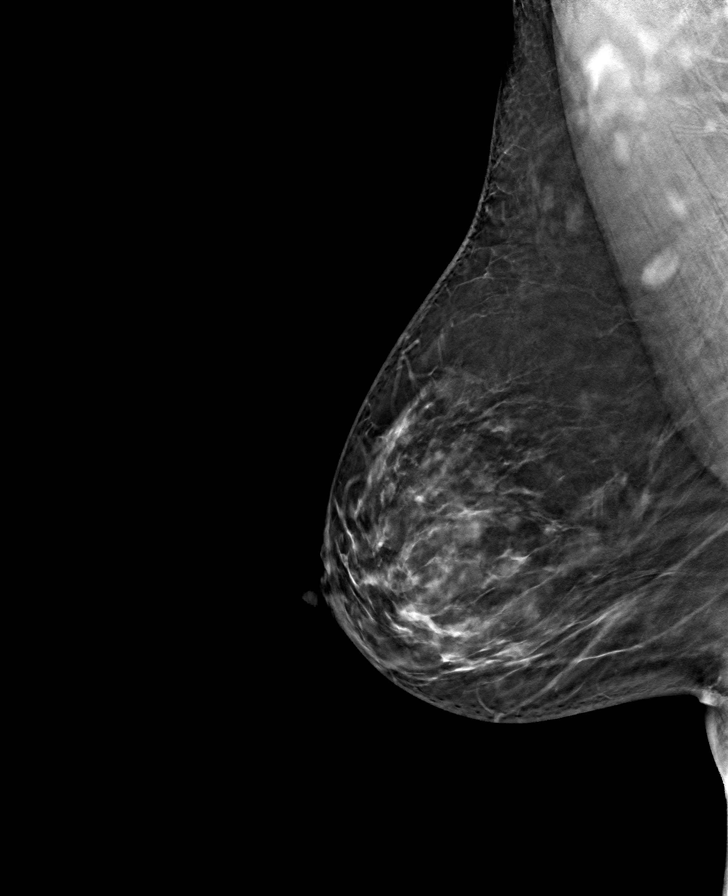

[L MLO tomo · tomo slice 43/84.0]
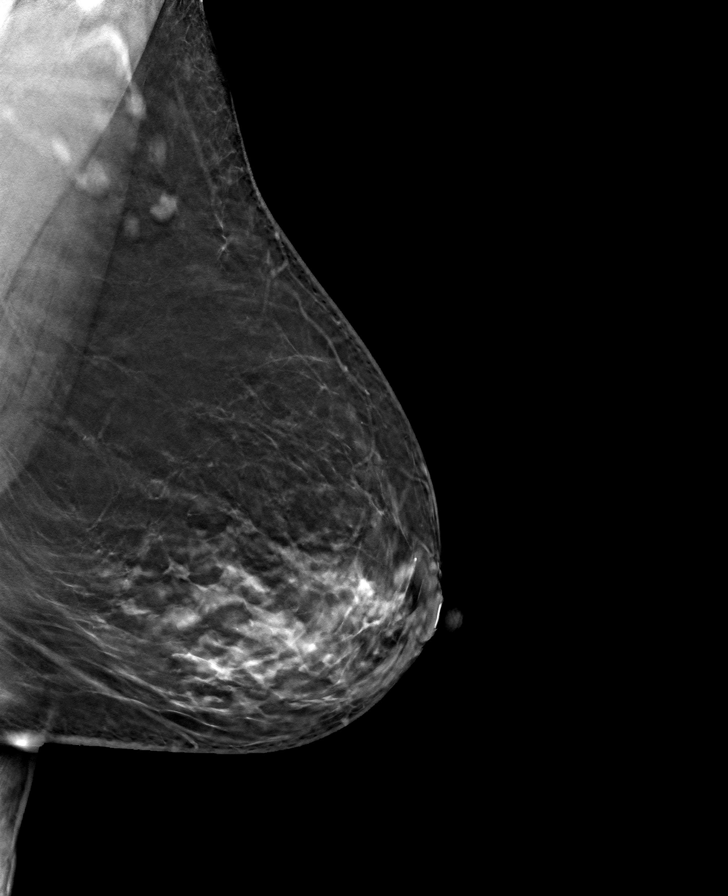

[L CC tomo · tomo slice 41/80.0]
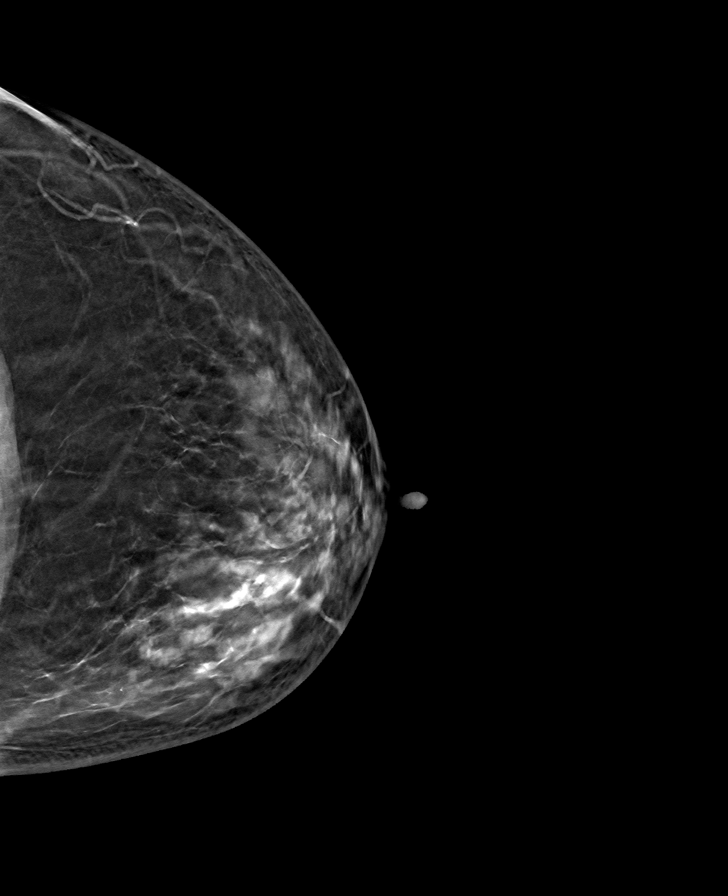

[R CC tomo · tomo slice 33/64.0]
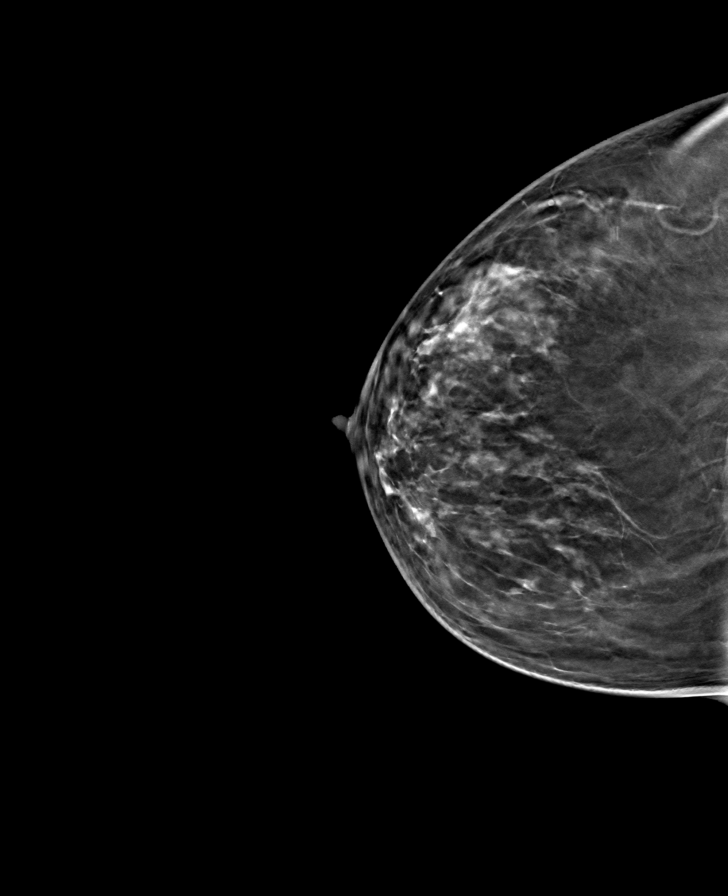

[8 of 24 positions shown; findings below may reference images not displayed]

ACR Breast Density Category c: The breast tissue is heterogeneously
dense, which may obscure small masses.
FINDINGS: There are no findings suspicious for malignancy. The images were
evaluated with computer-aided detection.
IMPRESSION: No mammographic evidence of malignancy. A result letter of this
screening mammogram will be mailed directly to the patient.

RECOMMENDATION:
Screening mammogram in one year. (Code:T4-5-GWO)

BI-RADS CATEGORY  1: Negative.

## 2022-04-15 ENCOUNTER — Emergency Department
Admission: EM | Admit: 2022-04-15 | Discharge: 2022-04-15 | Disposition: A | Discharge status: Home / Self Care | Payer: Medicare Other | Attending: Student in an Organized Health Care Education/Training Program | Admitting: Student in an Organized Health Care Education/Training Program

## 2022-04-15 ENCOUNTER — Emergency Department: Payer: Medicare Other

## 2022-04-15 ENCOUNTER — Other Ambulatory Visit: Payer: Self-pay

## 2022-04-15 DIAGNOSIS — K0889 Other specified disorders of teeth and supporting structures: Secondary | ICD-10-CM | POA: Diagnosis not present

## 2022-04-15 DIAGNOSIS — R6884 Jaw pain: Secondary | ICD-10-CM | POA: Insufficient documentation

## 2022-04-15 LAB — CBC WITH DIFFERENTIAL/PLATELET
Abs Immature Granulocytes: 0.02 10*3/uL (ref 0.00–0.07)
Basophils Absolute: 0 10*3/uL (ref 0.0–0.1)
Basophils Relative: 0 %
Eosinophils Absolute: 0.1 10*3/uL (ref 0.0–0.5)
Eosinophils Relative: 1 %
HCT: 43.8 % (ref 36.0–46.0)
Hemoglobin: 14.2 g/dL (ref 12.0–15.0)
Immature Granulocytes: 0 %
Lymphocytes Relative: 11 %
Lymphs Abs: 1.2 10*3/uL (ref 0.7–4.0)
MCH: 29.2 pg (ref 26.0–34.0)
MCHC: 32.4 g/dL (ref 30.0–36.0)
MCV: 90.1 fL (ref 80.0–100.0)
Monocytes Absolute: 0.8 10*3/uL (ref 0.1–1.0)
Monocytes Relative: 8 %
Neutro Abs: 8.7 10*3/uL — ABNORMAL HIGH (ref 1.7–7.7)
Neutrophils Relative %: 80 %
Platelets: 250 10*3/uL (ref 150–400)
RBC: 4.86 MIL/uL (ref 3.87–5.11)
RDW: 13.4 % (ref 11.5–15.5)
WBC: 10.9 10*3/uL — ABNORMAL HIGH (ref 4.0–10.5)
nRBC: 0 % (ref 0.0–0.2)

## 2022-04-15 LAB — COMPREHENSIVE METABOLIC PANEL
ALT: 17 U/L (ref 0–44)
AST: 22 U/L (ref 15–41)
Albumin: 3.9 g/dL (ref 3.5–5.0)
Alkaline Phosphatase: 59 U/L (ref 38–126)
Anion gap: 11 (ref 5–15)
BUN: 9 mg/dL (ref 8–23)
CO2: 23 mmol/L (ref 22–32)
Calcium: 9.6 mg/dL (ref 8.9–10.3)
Chloride: 102 mmol/L (ref 98–111)
Creatinine, Ser: 0.73 mg/dL (ref 0.44–1.00)
GFR, Estimated: >60 mL/min (ref >60)
Glucose, Bld: 96 mg/dL (ref 70–99)
Potassium: 3.5 mmol/L (ref 3.5–5.1)
Sodium: 136 mmol/L (ref 135–145)
Total Bilirubin: 1.2 mg/dL (ref 0.3–1.2)
Total Protein: 7.3 g/dL (ref 6.5–8.1)

## 2022-04-15 LAB — TROPONIN I (HIGH SENSITIVITY): Troponin I (High Sensitivity): 4 ng/L (ref <18)

## 2022-04-15 MED ORDER — AMOXICILLIN 500 MG PO CAPS: 500.0000 mg | ORAL_CAPSULE | Freq: Three times a day (TID) | ORAL | 0 refills | Status: DC

## 2022-04-15 MED ORDER — ONDANSETRON 4 MG PO TBDP
4.0000 mg | ORAL_TABLET | Freq: Once | ORAL | Status: AC
  Administered 2022-04-15: 4 mg via ORAL
  Filled 2022-04-15: qty 1

## 2022-04-15 MED ORDER — OXYCODONE-ACETAMINOPHEN 5-325 MG PO TABS
1.0000 | ORAL_TABLET | Freq: Once | ORAL | Status: AC
  Administered 2022-04-15: 1 via ORAL
  Filled 2022-04-15: qty 1

## 2022-04-15 MED ORDER — ONDANSETRON 4 MG PO TBDP: 4.0000 mg | ORAL_TABLET | Freq: Three times a day (TID) | ORAL | 0 refills | Status: DC | PRN

## 2022-04-15 MED ORDER — HYDROCODONE-ACETAMINOPHEN 5-325 MG PO TABS: 1.0000 | ORAL_TABLET | Freq: Four times a day (QID) | ORAL | 0 refills | Status: DC | PRN

## 2022-04-15 NOTE — Discharge Instructions (Signed)
Follow up with [facility] oral surgeon, ask your dentist to refer if they have not already referred you Address: [address] [place], [place], [place] [address] Phone: [phone]  Take the antibiotic as prescribed Use the zofran for nausea prior to taking the pain medication Return if worsening

## 2022-04-15 NOTE — ED Provider Notes (Signed)
[facility] Provider Note    Event Date/Time   First MD Initiated Contact with Patient 04/15/22 1205     (approximate)   History   Dental Pain   HPI  Cassie Walters is a 82 y.o. female with history of hypertension, cholesterol, liver density presents emergency department complaining of left-sided jaw pain for 2 to 3 days.  When asked about chest pain she states she did have some chest discomfort last night but does not know if it was just the way she was sleeping.  Does not have pain at this time.  But still has pain in the left jaw.  Patient went to her dentist office and they told her they do not handle patients that are on Fosamax.  Saw another dentist who said the same.  States she will need to see oral surgeon.  Neither 1 put her on antibiotics or pain medication      Physical Exam   Triage Vital Signs: ED Triage Vitals  Enc Vitals Group     BP 04/15/22 1134 (!) 167/82     Pulse Rate 04/15/22 1134 85     Resp 04/15/22 1134 20     Temp 04/15/22 1134 98.2 F (36.8 C)     Temp Source 04/15/22 1134 Oral     SpO2 04/15/22 1134 99 %     Weight --      Height --      Head Circumference --      Peak Flow --      Pain Score 04/15/22 1124 6     Pain Loc --      Pain Edu? --      Excl. in [place]? --     Most recent vital signs: Vitals:   04/15/22 1134  BP: (!) 167/82  Pulse: 85  Resp: 20  Temp: 98.2 F (36.8 C)  SpO2: 99%     General: Awake, no distress.   CV:  Good peripheral perfusion. regular rate and  rhythm Resp:  Normal effort. Lungs cta Abd:  No distention.   Other:      ED Results / Procedures / Treatments   Labs (all labs ordered are listed, but only abnormal results are displayed) Labs Reviewed  CBC WITH DIFFERENTIAL/PLATELET - Abnormal; Notable for the following components:      Result Value   WBC 10.9 (*)    Neutro Abs 8.7 (*)    All other components within normal limits  COMPREHENSIVE METABOLIC PANEL  TROPONIN I  (HIGH SENSITIVITY)  TROPONIN I (HIGH SENSITIVITY)     EKG  EKG   RADIOLOGY Chest x-ray    PROCEDURES:   Procedures   MEDICATIONS ORDERED IN ED: Medications  ondansetron (ZOFRAN-ODT) disintegrating tablet 4 mg (has no administration in time range)  oxyCODONE-acetaminophen (PERCOCET/ROXICET) 5-325 MG per tablet 1 tablet (has no administration in time range)     IMPRESSION / MDM / ASSESSMENT AND PLAN / ED COURSE  I reviewed the triage vital signs and the nursing notes.                              Differential diagnosis includes, but is not limited to, dental pain, dental abscess, dental caries, MI, angina  Patient's presentation is most consistent with acute presentation with potential threat to life or bodily function.   Due to the patient having chest pain last night along with a left-sided jaw pain I  feel that she does require a cardiac workup.  Labs and imaging ordered, EKG ordered  EKG shows normal sinus rhythm, see physician read, I interpret this as no acute abnormality on the EKG.   Patient's labs are reassuring  Will not repeat second troponin as her first troponin is normal and the event happened last night around 8 PM.  Patient states she just has pain from where her tooth is.  Will give her Zofran ODT to prevent nausea vomiting she gets with Percocet.  She is given 1 Percocet here in the ED.  Prescriptions were sent to her pharmacy and [facility].  Gave her the phone number for oral surgeons and.  She is to talk to her regular dentist about this referral.  She is in agreement treatment plan.  Discharged stable condition.   FINAL CLINICAL IMPRESSION(S) / ED DIAGNOSES   Final diagnoses:  Jaw pain  Pain, dental     Rx / DC Orders   ED Discharge Orders          Ordered    amoxicillin (AMOXIL) 500 MG capsule  3 times daily        04/15/22 1406    HYDROcodone-acetaminophen (NORCO/VICODIN) 5-325 MG tablet  Every 6 hours PRN        04/15/22 1406     ondansetron (ZOFRAN-ODT) 4 MG disintegrating tablet  Every 8 hours PRN        04/15/22 1406             Note:  This document was prepared using Dragon voice recognition software and may include unintentional dictation errors.    Versie Starks, PA-C 04/15/22 1415    Merlyn Lot, MD 04/15/22 [phone]

## 2022-04-15 NOTE — ED Notes (Signed)
Called lab for blood draw

## 2022-04-15 NOTE — ED Triage Notes (Signed)
Pt presents to ED via POV with c/o of L sided lower dental pain. Pt states this started Sunday. Pt denies fevers or chills. NAD noted.

## 2022-04-22 ENCOUNTER — Ambulatory Visit: Payer: Medicare Other | Admitting: Podiatry

## 2022-04-29 ENCOUNTER — Encounter (INDEPENDENT_AMBULATORY_CARE_PROVIDER_SITE_OTHER): Payer: Medicare Other | Admitting: Vascular Surgery

## 2022-05-10 ENCOUNTER — Emergency Department
Admission: EM | Admit: 2022-05-10 | Discharge: 2022-05-11 | Disposition: A | Discharge status: Home / Self Care | Payer: Medicare Other | Attending: Emergency Medicine | Admitting: Emergency Medicine

## 2022-05-10 ENCOUNTER — Other Ambulatory Visit: Payer: Self-pay

## 2022-05-10 ENCOUNTER — Emergency Department: Payer: Medicare Other

## 2022-05-10 DIAGNOSIS — K644 Residual hemorrhoidal skin tags: Secondary | ICD-10-CM | POA: Diagnosis not present

## 2022-05-10 DIAGNOSIS — K625 Hemorrhage of anus and rectum: Secondary | ICD-10-CM | POA: Diagnosis present

## 2022-05-10 DIAGNOSIS — K649 Unspecified hemorrhoids: Secondary | ICD-10-CM

## 2022-05-10 DIAGNOSIS — K5900 Constipation, unspecified: Secondary | ICD-10-CM | POA: Diagnosis not present

## 2022-05-10 LAB — COMPREHENSIVE METABOLIC PANEL
ALT: 23 U/L (ref 0–44)
AST: 27 U/L (ref 15–41)
Albumin: 3.8 g/dL (ref 3.5–5.0)
Alkaline Phosphatase: 48 U/L (ref 38–126)
Anion gap: 7 (ref 5–15)
BUN: 24 mg/dL — ABNORMAL HIGH (ref 8–23)
CO2: 25 mmol/L (ref 22–32)
Calcium: 9.7 mg/dL (ref 8.9–10.3)
Chloride: 105 mmol/L (ref 98–111)
Creatinine, Ser: 0.75 mg/dL (ref 0.44–1.00)
GFR, Estimated: >60 mL/min (ref >60)
Glucose, Bld: 122 mg/dL — ABNORMAL HIGH (ref 70–99)
Potassium: 3.4 mmol/L — ABNORMAL LOW (ref 3.5–5.1)
Sodium: 137 mmol/L (ref 135–145)
Total Bilirubin: 0.5 mg/dL (ref 0.3–1.2)
Total Protein: 6.7 g/dL (ref 6.5–8.1)

## 2022-05-10 LAB — TYPE AND SCREEN
ABO/RH(D): O POS
Antibody Screen: NEGATIVE

## 2022-05-10 LAB — CBC WITH DIFFERENTIAL/PLATELET
Abs Immature Granulocytes: 0.03 10*3/uL (ref 0.00–0.07)
Basophils Absolute: 0 10*3/uL (ref 0.0–0.1)
Basophils Relative: 0 %
Eosinophils Absolute: 0.1 10*3/uL (ref 0.0–0.5)
Eosinophils Relative: 1 %
HCT: 37.9 % (ref 36.0–46.0)
Hemoglobin: 12.1 g/dL (ref 12.0–15.0)
Immature Granulocytes: 0 %
Lymphocytes Relative: 19 %
Lymphs Abs: 1.9 10*3/uL (ref 0.7–4.0)
MCH: 29.1 pg (ref 26.0–34.0)
MCHC: 31.9 g/dL (ref 30.0–36.0)
MCV: 91.1 fL (ref 80.0–100.0)
Monocytes Absolute: 0.5 10*3/uL (ref 0.1–1.0)
Monocytes Relative: 5 %
Neutro Abs: 7.3 10*3/uL (ref 1.7–7.7)
Neutrophils Relative %: 75 %
Platelets: 219 10*3/uL (ref 150–400)
RBC: 4.16 MIL/uL (ref 3.87–5.11)
RDW: 13.8 % (ref 11.5–15.5)
WBC: 9.8 10*3/uL (ref 4.0–10.5)
nRBC: 0 % (ref 0.0–0.2)

## 2022-05-10 NOTE — ED Triage Notes (Signed)
Pt reports "I've got a bleeding hemorrhoid and my bm have been tight since this morning." Reports scant amount of bleeding after "I strained quite a bit." Pt denies abd pain or cramping. Denies n/v. Pt alert and oriented. Breathing unlabored speaking in full sentences with symmetric chest rise and fall. BIB [facility] from home.

## 2022-05-10 NOTE — ED Triage Notes (Signed)
EMS brings pt in from home for c/o hemorrhoids and rectal bleeding

## 2022-05-10 NOTE — ED Triage Notes (Deleted)
EMS brings pt in from home for c/o bilat leg pain x week

## 2022-05-11 MED ORDER — POTASSIUM CHLORIDE CRYS ER 20 MEQ PO TBCR: 20.0000 meq | EXTENDED_RELEASE_TABLET | Freq: Every day | ORAL | 0 refills | Status: DC

## 2022-05-11 NOTE — Discharge Instructions (Addendum)
You were seen in the emergency department today for constipation and hemorroids.  The rest of your exam was reassuring.  Your potassium was slightly low, and we recommend a supplement for the next week or so, but you can follow up about this issue with your regular doctor.  We recommend that you use one or more of the following over-the-counter medications in the order described:   1)  Miralax (powder):  This medication works by drawing additional fluid into your intestines and helps to flush out your stool.  Mix the powder with water or juice according to label instructions.  Be sure to use the recommended amount of water or juice when you mix up the powder.  Plenty of fluids will help to prevent constipation. 2)  Colace (or Dulcolax) 100 mg:  This is a stool softener, and you may take it once or twice a day as needed. 3)  Senna tablets:  This is a bowel stimulant that will help "push" out your stool. It is the next step to add after you have tried a stool softener.  You may also want to consider using glycerin suppositories, which you insert into your rectum.  You hold it in place and is dissolves and softens your stool and stimulates your bowels.  You could also consider using an enema, which is also available over the counter.  Remember that narcotic pain medications are constipating, so avoid them or minimize their use.  Drink plenty of fluids.  Regarding your hemorrhoid, there is no immediate fix for them, but please read through the included information for recommendations.  Sitting in warm water (Sitz baths) can help a lot.  You can also use over-the-counter ointments and creams.  You should also consider trying a high-fiber diet.  Please fill any prescriptions you may have been provided and use them as recommended on the label instructions.  Follow-up with the doctor listed for an outpatient follow-up visit to discuss additional treatment options.  Remember that hemorrhoids can bleed and this is  frequently normal, but if the bleeding becomes severe, you may need to return to the emergency department for evaluation.    Please return to the Emergency Department immediately if you develop new or worsening symptoms that concern you, such as (but not limited to) fever > 101 degrees, severe abdominal pain, or persistent vomiting.

## 2022-05-11 NOTE — ED Notes (Signed)
Assisted md with rectal exam.  

## 2022-05-11 NOTE — ED Provider Notes (Signed)
[facility] Provider Note    Event Date/Time   First MD Initiated Contact with Patient 05/10/22 2347     (approximate)   History   Rectal Bleeding   HPI  Cassie Walters is a 82 y.o. female who is here with her niece at bedside and presents for evaluation of some constipation and a little bit of rectal bleeding that she associates with her hemorrhoids.  She said that she knows that she has an umbilical hernia and that in the past she has had problems with bleeding hemorrhoids when she gets a little constipated.  She has not been having that problem recently and usually has a bowel movement every morning, but this morning she did not have a bowel movement.  She started to feel "bound up" and then when she had a small bowel movement earlier and was straining she could feel that her hemorrhoid got bigger and that she was having a little bit of blood on the tissue.  However, she said that after coming to the emergency department, she had a large bowel movement and feels much better now.  At no point has she had any sharp abdominal pain.  She has not felt particularly bloated.  She has had no nausea and no vomiting.  She has been in her usual state of health recently.  She took a MiraLAX earlier today which she thinks might of helped.  She has not taken anything else to soften or move her bowels.  She denies any abdominal pain at this time and says she did not feel any pain earlier, just felt a little bit "tight".    Physical Exam   Triage Vital Signs: ED Triage Vitals  Enc Vitals Group     BP 05/10/22 2115 111/73     Pulse Rate 05/10/22 2115 78     Resp 05/10/22 2115 18     Temp 05/10/22 2115 98.3 F (36.8 C)     Temp Source 05/10/22 2115 Oral     SpO2 05/10/22 2115 100 %     Weight 05/10/22 2116 62.6 kg (138 lb)     Height 05/10/22 2116 1.626 m (5\' 4" )     Head Circumference --      Peak Flow --      Pain Score 05/10/22 2116 0     Pain Loc --      Pain  Edu? --      Excl. in [place]? --     Most recent vital signs: Vitals:   05/10/22 2115  BP: 111/73  Pulse: 78  Resp: 18  Temp: 98.3 F (36.8 C)  SpO2: 100%     General: Awake, no distress.  CV:  Good peripheral perfusion.  Regular rate and rhythm. Resp:  Normal effort. Speaking easily and comfortably, no accessory muscle usage nor intercostal retractions.   Abd:  No distention.  No tenderness to palpation throughout the abdomen.  No rebound or guarding. Rectal:  The patient has some large skin tags on her buttocks, not close to the anus, which she was aware of evidence that has been there for 20+ years.  She has external nonthrombosed hemorrhoids on the right side of her anus that are minimally tender to palpation, no active bleeding.  No melena or hematochezia is obvious on exam.  ED nurse present throughout as chaperone.   ED Results / Procedures / Treatments   Labs (all labs ordered are listed, but only abnormal results are displayed) Labs Reviewed  COMPREHENSIVE METABOLIC PANEL - Abnormal; Notable for the following components:      Result Value   Potassium 3.4 (*)    Glucose, Bld 122 (*)    BUN 24 (*)    All other components within normal limits  CBC WITH DIFFERENTIAL/PLATELET  TYPE AND SCREEN    Radiology: I viewed and interpreted the patient's 1 view abdominal x-ray and I see no evidence of obstruction nor free air.  The radiologist brought up the possibility of small bowel dilatation but it is far from being definitive.   PROCEDURES:  Critical Care performed: No  Procedures   MEDICATIONS ORDERED IN ED: Medications - No data to display   IMPRESSION / MDM / [place] / ED COURSE  I reviewed the triage vital signs and the nursing notes.                              Differential diagnosis includes, but is not limited to, constipation, hemorrhoids, SBO/ileus, electrolyte or metabolic abnormality including kidney injury, dehydration, diverticulitis,  diverticulosis.  Patient's presentation is most consistent with acute presentation with potential threat to life or bodily function.  Labs/studies ordered: Comprehensive metabolic panel, CBC with differential, type and screen, 1 view abdomen x-ray. [facility] Course my include additional interventions or labs/studies not listed above.)  And spite of the patient's age and initial concerns, she is quite well-appearing, alert and oriented and interactive, and in no distress.  Vital signs have been stable and reassuring throughout 3+ hours in the emergency department.  She laughed when she said that she feels all better after having a big bowel movement when she was waiting.  At no point has she had any significant abdominal pain, and she has absolutely no tenderness to palpation at this time.  As documented above, I do not see any sign of any acute abnormality on x-ray.  The radiologist mention the possibility of bowel dilatation in the small bowel, but clinically I have no concerns for ileus or SBO.  Her labs are also quite reassuring.  She has no acute abnormalities other than very slight hypokalemia which could theoretically be contributing to the constipation, although it seems somewhat unlikely.  Her hemoglobin is normal and she has no leukocytosis.  However will write her prescription for potassium supplements.  I spoke with the patient and her family member about the findings and how I think she was just a little bit constipated but seems to be feeling much better now.  She says she is ready to go home.  I offered to get a CT scan to definitively rule out SBO and ileus, but she would rather go home and says she feels fine now.  I encouraged her to come back immediately if she develops any new or worsening symptoms, but she will try an over-the-counter bowel regimen at home and follow-up with her regular doctor.       FINAL CLINICAL IMPRESSION(S) / ED DIAGNOSES   Final diagnoses:   Constipation, unspecified constipation type  Hemorrhoids, unspecified hemorrhoid type     Rx / DC Orders   ED Discharge Orders          Ordered    potassium chloride SA (KLOR-CON M20) 20 MEQ tablet  Daily        05/11/22 0036             Note:  This document was prepared using Dragon voice recognition software and  may include unintentional dictation errors.   Hinda Kehr, MD 05/11/22 [phone]

## 2022-05-12 ENCOUNTER — Encounter (INDEPENDENT_AMBULATORY_CARE_PROVIDER_SITE_OTHER): Payer: Medicare Other

## 2022-05-12 ENCOUNTER — Encounter (INDEPENDENT_AMBULATORY_CARE_PROVIDER_SITE_OTHER): Payer: Medicare Other | Admitting: Vascular Surgery

## 2022-05-13 ENCOUNTER — Ambulatory Visit: Payer: Medicare Other | Admitting: Podiatry

## 2022-05-13 DIAGNOSIS — M79674 Pain in right toe(s): Secondary | ICD-10-CM | POA: Diagnosis not present

## 2022-05-13 DIAGNOSIS — B351 Tinea unguium: Secondary | ICD-10-CM | POA: Diagnosis not present

## 2022-05-13 DIAGNOSIS — M79675 Pain in left toe(s): Secondary | ICD-10-CM

## 2022-05-13 NOTE — Progress Notes (Signed)
   Chief Complaint  Patient presents with   Nail Problem    Patient came in today for Routine foot care, nail trim and callus, bilateral 2nd toes are sore.    SUBJECTIVE Patient presents to office today complaining of elongated, thickened nails that cause pain while ambulating in shoes.  Patient is unable to trim their own nails. Patient is here for further evaluation and treatment.  Past Medical History:  Diagnosis Date   Closed fracture of phalanx of foot 01/25/2017   High cholesterol    Hypertension    Low bone density     Allergies  Allergen Reactions   Hydrocodone    Latex    Lisinopril Cough   Oxycodone    Sulfa Antibiotics      OBJECTIVE General Patient is awake, alert, and oriented x 3 and in no acute distress. Derm Skin is dry and supple bilateral. Negative open lesions or macerations. Remaining integument unremarkable. Nails are tender, long, thickened and dystrophic with subungual debris, consistent with onychomycosis, 1-5 bilateral. No signs of infection noted. Vasc  DP and PT pedal pulses palpable bilaterally. Temperature gradient within normal limits.  Neuro Epicritic and protective threshold sensation grossly intact bilaterally.  Musculoskeletal Exam No symptomatic pedal deformities noted bilateral. Muscular strength within normal limits.  ASSESSMENT 1.  Pain due to onychomycosis of toenails both 2.  Symptomatic calluses bilateral feet  PLAN OF CARE 1. Patient evaluated today.  2. Instructed to maintain good pedal hygiene and foot care.  3. Mechanical debridement of nails 1-5 bilaterally performed using a nail nipper. Filed with dremel without incident.  4.  Excisional debridement of the hyperkeratotic calluses was performed using a 312 scalpel without incident or bleeding.  Patient felt relief  5.  Return to clinic as needed   Felecia Shelling, DPM [facility]  Dr. Felecia Shelling, DPM    [address] [place], [place] [address]                Office [phone]  Fax [phone]

## 2022-06-21 ENCOUNTER — Other Ambulatory Visit (INDEPENDENT_AMBULATORY_CARE_PROVIDER_SITE_OTHER): Payer: Self-pay | Admitting: Nurse Practitioner

## 2022-06-21 DIAGNOSIS — I739 Peripheral vascular disease, unspecified: Secondary | ICD-10-CM

## 2022-06-23 ENCOUNTER — Encounter (INDEPENDENT_AMBULATORY_CARE_PROVIDER_SITE_OTHER): Payer: Medicare Other | Admitting: Vascular Surgery

## 2022-06-23 ENCOUNTER — Encounter (INDEPENDENT_AMBULATORY_CARE_PROVIDER_SITE_OTHER): Payer: Medicare Other

## 2022-08-08 ENCOUNTER — Encounter (INDEPENDENT_AMBULATORY_CARE_PROVIDER_SITE_OTHER): Payer: Self-pay | Admitting: Vascular Surgery

## 2022-08-08 ENCOUNTER — Ambulatory Visit (INDEPENDENT_AMBULATORY_CARE_PROVIDER_SITE_OTHER): Payer: Medicare Other | Admitting: Vascular Surgery

## 2022-08-08 ENCOUNTER — Ambulatory Visit (INDEPENDENT_AMBULATORY_CARE_PROVIDER_SITE_OTHER): Payer: Medicare Other

## 2022-08-08 VITALS — BP 150/70 | HR 66 | Resp 18 | Ht 64.0 in | Wt 137.6 lb

## 2022-08-08 DIAGNOSIS — I739 Peripheral vascular disease, unspecified: Secondary | ICD-10-CM

## 2022-08-08 DIAGNOSIS — E782 Mixed hyperlipidemia: Secondary | ICD-10-CM | POA: Diagnosis not present

## 2022-08-08 DIAGNOSIS — M79605 Pain in left leg: Secondary | ICD-10-CM

## 2022-08-08 DIAGNOSIS — K219 Gastro-esophageal reflux disease without esophagitis: Secondary | ICD-10-CM | POA: Diagnosis not present

## 2022-08-08 DIAGNOSIS — M79604 Pain in right leg: Secondary | ICD-10-CM

## 2022-08-08 DIAGNOSIS — I10 Essential (primary) hypertension: Secondary | ICD-10-CM | POA: Diagnosis not present

## 2022-08-20 ENCOUNTER — Encounter (INDEPENDENT_AMBULATORY_CARE_PROVIDER_SITE_OTHER): Payer: Self-pay | Admitting: Vascular Surgery

## 2022-08-20 DIAGNOSIS — K219 Gastro-esophageal reflux disease without esophagitis: Secondary | ICD-10-CM | POA: Insufficient documentation

## 2022-08-20 DIAGNOSIS — E785 Hyperlipidemia, unspecified: Secondary | ICD-10-CM | POA: Insufficient documentation

## 2022-08-20 DIAGNOSIS — I10 Essential (primary) hypertension: Secondary | ICD-10-CM | POA: Insufficient documentation

## 2022-08-20 DIAGNOSIS — M79606 Pain in leg, unspecified: Secondary | ICD-10-CM | POA: Insufficient documentation

## 2022-08-20 NOTE — Progress Notes (Signed)
MRN : [id number]  Cassie Walters is a 82 y.o. (06-09-1940) female who presents with chief complaint of check circulation.  History of Present Illness:   The patient is seen for evaluation of painful lower extremities. Patient notes the pain is mostly like an uncomfortable coldness that is variable and not always associated with activity.  The pain is somewhat consistent day to day occurring on most days. The patient notes the pain also occurs with standing and routinely seems worse as the day wears on. The pain has been progressive over the past several years. The patient states these symptoms are causing  a negative impact on quality of life and daily activities which was a factor in the referral.  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   The patient denies rest pain or dangling of an extremity off the side of the bed during the night for relief. No open wounds or sores at this time. No history of DVT or phlebitis. No prior vascular interventions or surgeries.   Current Meds  Medication Sig   alendronate (FOSAMAX) 70 MG tablet Take 70 mg by mouth once a week.    amLODipine (NORVASC) 5 MG tablet Take 5 mg by mouth daily.   amoxicillin (AMOXIL) 500 MG capsule Take 1 capsule (500 mg total) by mouth 3 (three) times daily.   cetirizine (ZYRTEC) 10 MG tablet Take 10 mg by mouth daily.   cholecalciferol (VITAMIN D) 25 MCG (1000 UNIT) tablet Take 1,000 Units by mouth daily.   clotrimazole (LOTRIMIN) 1 % cream    diclofenac (VOLTAREN) 50 MG EC tablet diclofenac sodium 50 mg tablet,delayed release   fluticasone (FLONASE) 50 MCG/ACT nasal spray Place into both nostrils.   HYDROcodone-acetaminophen (NORCO/VICODIN) 5-325 MG tablet Take 1 tablet by mouth every 6 (six) hours as needed for moderate pain.   lactulose (CHRONULAC) 10 GM/15ML solution Take 45 mLs (30 g total) by mouth daily  as needed for mild constipation.   lidocaine (LIDODERM) 5 % Place 1 patch onto the skin daily. Remove & Discard patch within 12 hours or as directed by MD   olmesartan (BENICAR) 20 MG tablet Take 20 mg by mouth 1 day or 1 dose.   omeprazole (PRILOSEC) 20 MG capsule Take 20 mg by mouth daily.   ondansetron (ZOFRAN-ODT) 4 MG disintegrating tablet Take 1 tablet (4 mg total) by mouth every 8 (eight) hours as needed.   polyethylene glycol powder (GLYCOLAX/MIRALAX) powder    potassium chloride SA (KLOR-CON M20) 20 MEQ tablet Take 1 tablet (20 mEq total) by mouth daily.   pravastatin (PRAVACHOL) 20 MG tablet Take 20 mg by mouth daily.   tiZANidine (ZANAFLEX) 2 MG tablet Take by mouth every 6 (six) hours as needed for muscle spasms.   Turmeric (QC TUMERIC COMPLEX PO) Take by mouth.    Past Medical History:  Diagnosis Date   Closed fracture of phalanx of foot 01/25/2017   High cholesterol    Hypertension    Low bone density     Past Surgical History:  Procedure  Laterality Date   ABDOMINAL HYSTERECTOMY     BUNIONECTOMY Bilateral    CHOLECYSTECTOMY      Social History Social History   Tobacco Use   Smoking status: Never   Smokeless tobacco: Never  Vaping Use   Vaping status: Never Used  Substance Use Topics   Alcohol use: No   Drug use: No    Family History Family History  Problem Relation Age of Onset   Hypertension Mother    Dementia Mother    Hypertension Father    Lung cancer Brother    Lung cancer Brother     Allergies  Allergen Reactions   Hydrocodone    Latex    Lisinopril Cough   Oxycodone    Sulfa Antibiotics      REVIEW OF SYSTEMS (Negative unless checked)  Constitutional: [] Weight loss  [] Fever  [] Chills Cardiac: [] Chest pain   [] Chest pressure   [] Palpitations   [] Shortness of breath when laying flat   [] Shortness of breath with exertion. Vascular:  [x] Pain in legs with walking   [] Pain in legs at rest  [] History of DVT   [] Phlebitis   [] Swelling in  legs   [] Varicose veins   [] Non-healing ulcers Pulmonary:   [] Uses home oxygen   [] Productive cough   [] Hemoptysis   [] Wheeze  [] COPD   [] Asthma Neurologic:  [] Dizziness   [] Seizures   [] History of stroke   [] History of TIA  [] Aphasia   [] Vissual changes   [] Weakness or numbness in arm   [] Weakness or numbness in leg Musculoskeletal:   [] Joint swelling   [] Joint pain   [] Low back pain Hematologic:  [] Easy bruising  [] Easy bleeding   [] Hypercoagulable state   [] Anemic Gastrointestinal:  [] Diarrhea   [] Vomiting  [x] Gastroesophageal reflux/heartburn   [] Difficulty swallowing. Genitourinary:  [] Chronic kidney disease   [] Difficult urination  [] Frequent urination   [] Blood in urine Skin:  [] Rashes   [] Ulcers  Psychological:  [] History of anxiety   []  History of major depression.  Physical Examination  Vitals:   08/08/22 0958 08/08/22 1000  BP: (!) 165/71 (!) 150/70  Pulse: 76 66  Resp: 18 18  Weight: 137 lb 9.6 oz (62.4 kg)   Height: 5\' 4"  (1.626 m)    Body mass index is 23.62 kg/m. Gen: WD/WN, NAD Head: [place]/AT, No temporalis wasting.  Ear/Nose/Throat: Hearing grossly intact, nares w/o erythema or drainage Eyes: PER, EOMI, sclera nonicteric.  Neck: Supple, no masses.  No bruit or JVD.  Pulmonary:  Good air movement, no audible wheezing, no use of accessory muscles.  Cardiac: RRR, normal S1, S2, no Murmurs. Vascular:  mild trophic changes, no open wounds Vessel Right Left  Radial Palpable Palpable  PT  Palpable Palpable  DP Palpable Palpable  Gastrointestinal: soft, non-distended. No guarding/no peritoneal signs.  Musculoskeletal: M/S 5/5 throughout.  No visible deformity.  Neurologic: CN 2-12 intact. Pain and light touch intact in extremities.  Symmetrical.  Speech is fluent. Motor exam as listed above. Psychiatric: Judgment intact, Mood & affect appropriate for pt's clinical situation. Dermatologic: No rashes or ulcers noted.  No changes consistent with cellulitis.   CBC Lab  Results  Component Value Date   WBC 9.8 05/10/2022   HGB 12.1 05/10/2022   HCT 37.9 05/10/2022   MCV 91.1 05/10/2022   PLT 219 05/10/2022    BMET    Component Value Date/Time   NA 137 05/10/2022 2119   K 3.4 (L) 05/10/2022 2119   CL 105 05/10/2022 2119   CO2  25 05/10/2022 2119   GLUCOSE 122 (H) 05/10/2022 2119   BUN 24 (H) 05/10/2022 2119   CREATININE 0.75 05/10/2022 2119   CALCIUM 9.7 05/10/2022 2119   GFRNONAA >60 05/10/2022 2119   GFRAA 49 (L) 03/12/2017 0855   CrCl cannot be calculated (Patient's most recent lab result is older than the maximum 21 days allowed.).  COAG Lab Results  Component Value Date   INR 1.1 08/06/2021    Radiology VAS [place] ABI WITH/WO TBI  Result Date: 08/10/2022  LOWER EXTREMITY DOPPLER STUDY Patient Name:  Cassie Walters  Date of Exam:   08/08/2022 Medical Rec #: [id number]       Accession #:    [id number] Date of Birth: 1941-02-06       Patient Gender: F Patient Age:   50 years Exam Location:  [facility] Procedure:      VAS [place] ABI WITH/WO TBI Referring Phys: --------------------------------------------------------------------------------  High Risk Factors: Diabetes.  Performing Technologist: Salvadore Farber RVT  Examination Guidelines: A complete evaluation includes at minimum, Doppler waveform signals and systolic blood pressure reading at the level of bilateral brachial, anterior tibial, and posterior tibial arteries, when vessel segments are accessible. Bilateral testing is considered an integral part of a complete examination. Photoelectric Plethysmograph (PPG) waveforms and toe systolic pressure readings are included as required and additional duplex testing as needed. Limited examinations for reoccurring indications may be performed as noted.  ABI Findings: +---------+------------------+-----+---------+--------+ Right    Rt Pressure (mmHg)IndexWaveform Comment  +---------+------------------+-----+---------+--------+ Brachial 149                                       +---------+------------------+-----+---------+--------+ ATA      158               1.06 triphasic         +---------+------------------+-----+---------+--------+ PTA      163               1.09 triphasic         +---------+------------------+-----+---------+--------+ Great Toe105               0.70 Normal            +---------+------------------+-----+---------+--------+ +---------+------------------+-----+---------+-------+ Left     Lt Pressure (mmHg)IndexWaveform Comment +---------+------------------+-----+---------+-------+ Brachial 145                                     +---------+------------------+-----+---------+-------+ ATA      164               1.10 triphasic        +---------+------------------+-----+---------+-------+ PTA      163               1.09 triphasic        +---------+------------------+-----+---------+-------+ [place]               0.79 Normal           +---------+------------------+-----+---------+-------+  Summary: Right: Resting right ankle-brachial index is within normal range. The right toe-brachial index is normal. Included 2nd toe waveform mildly dampened. Left: Resting left ankle-brachial index is within normal range. The left toe-brachial index is normal. *See table(s) above for measurements and observations.  Electronically signed by Levora Dredge MD on 08/10/2022 at 10:28:36 AM.    Final  Assessment/Plan 1. Pain in both lower extremities Recommend:  The patient has atypical pain symptoms for vascular disease and on exam I do not find evidence of vascular pathology that would explain the patient's symptoms.  Noninvasive studies do not identify significant vascular problems  I suspect the patient is c/o pseudoclaudication.  Patient should have an evaluation of the LS spine which I defer to the primary service or the Spine service.  The patient should continue walking and begin a more  formal exercise program. The patient should continue his antiplatelet therapy and aggressive treatment of the lipid abnormalities.  Patient will follow-up with me on a PRN basis.  2. Essential hypertension Continue antihypertensive medications as already ordered, these medications have been reviewed and there are no changes at this time.  3. Mixed hyperlipidemia Continue statin as ordered and reviewed, no changes at this time  4. Gastroesophageal reflux disease, unspecified whether esophagitis present Continue PPI as already ordered, this medication has been reviewed and there are no changes at this time.  Avoidence of caffeine and alcohol  Moderate elevation of the head of the bed     Levora Dredge, MD  08/20/2022 2:05 PM

## 2023-01-10 ENCOUNTER — Other Ambulatory Visit: Payer: Self-pay | Admitting: Family Medicine

## 2023-01-10 DIAGNOSIS — R1032 Left lower quadrant pain: Secondary | ICD-10-CM

## 2023-01-20 ENCOUNTER — Ambulatory Visit
Admission: RE | Admit: 2023-01-20 | Discharge: 2023-01-20 | Disposition: A | Discharge status: Home / Self Care | Payer: Medicare Other | Source: Ambulatory Visit | Attending: Family Medicine | Admitting: Family Medicine

## 2023-01-20 DIAGNOSIS — R1032 Left lower quadrant pain: Secondary | ICD-10-CM | POA: Diagnosis present

## 2023-01-20 MED ORDER — IOHEXOL 300 MG/ML  SOLN
100.0000 mL | Freq: Once | INTRAMUSCULAR | Status: AC | PRN
  Administered 2023-01-20: 100 mL via INTRAVENOUS

## 2023-02-22 ENCOUNTER — Encounter: Payer: Self-pay | Admitting: Surgery

## 2023-02-22 ENCOUNTER — Ambulatory Visit (INDEPENDENT_AMBULATORY_CARE_PROVIDER_SITE_OTHER): Payer: Medicare Other | Admitting: Surgery

## 2023-02-22 VITALS — BP 150/73 | HR 99 | Temp 98.2°F | Ht 64.0 in | Wt 139.0 lb

## 2023-02-22 DIAGNOSIS — R1032 Left lower quadrant pain: Secondary | ICD-10-CM | POA: Diagnosis not present

## 2023-02-22 DIAGNOSIS — K429 Umbilical hernia without obstruction or gangrene: Secondary | ICD-10-CM

## 2023-02-22 NOTE — Patient Instructions (Addendum)
 We will have you follow up here in April to discuss having surgery.   Please call and ask to speak with a nurse if you develop questions or concerns.     Umbilical Hernia, Adult A hernia is a bulge of tissue that pushes through an opening between muscles. An umbilical hernia happens in the abdomen, near the belly button (umbilicus). The hernia may contain tissues from the small intestine, large intestine, or fatty tissue covering the intestines (omentum). Umbilical hernias in adults tend to get worse over time, and they require surgical treatment. There are several types of umbilical hernias. You may have: A hernia located just above or below the umbilicus (indirect hernia). This is the most common type of umbilical hernia in adults. A hernia that forms through an opening formed by the umbilicus (direct hernia). A hernia that comes and goes (reducible hernia). A reducible hernia may be visible only when you strain, lift something heavy, or cough. This type of hernia can be pushed back into the abdomen (reduced). A hernia that traps abdominal tissue inside the hernia (incarcerated hernia). This type of hernia cannot be reduced. A hernia that cuts off blood flow to the tissues inside the hernia (strangulated hernia). The tissues can start to die if this happens. This type of hernia requires emergency treatment.  What are the causes? An umbilical hernia happens when tissue inside the abdomen presses on a weak area of the abdominal muscles. What increases the risk? You may have a greater risk of this condition if you: Are obese. Have had several pregnancies. Have a buildup of fluid inside your abdomen (ascites). Have had surgery that weakens the abdominal muscles.  What are the signs or symptoms? The main symptom of this condition is a painless bulge at or near the belly button. A reducible hernia may be visible only when you strain, lift something heavy, or cough. Other symptoms may  include: Dull pain. A feeling of pressure.  Symptoms of a strangulated hernia may include: Pain that gets increasingly worse. Nausea and vomiting. Pain when pressing on the hernia. Skin over the hernia becoming red or purple. Constipation. Blood in the stool.  How is this diagnosed? This condition may be diagnosed based on: A physical exam. You may be asked to cough or strain while standing. These actions increase the pressure inside your abdomen and force the hernia through the opening in your muscles. Your health care provider may try to reduce the hernia by pressing on it. Your symptoms and medical history.  How is this treated? Surgery is the only treatment for an umbilical hernia. Surgery for a strangulated hernia is done as soon as possible. If you have a small hernia that is not incarcerated, you may need to lose weight before having surgery. Follow these instructions at home: Lose weight, if told by your health care provider. Do not try to push the hernia back in. Watch your hernia for any changes in color or size. Tell your health care provider if any changes occur. You may need to avoid activities that increase pressure on your hernia. Do not lift anything that is heavier than 10 lb (4.5 kg) until your health care provider says that this is safe. Take over-the-counter and prescription medicines only as told by your health care provider. Keep all follow-up visits as told by your health care provider. This is important. Contact a health care provider if: Your hernia gets larger. Your hernia becomes painful. Get help right away if: You develop  sudden, severe pain near the area of your hernia. You have pain as well as nausea or vomiting. You have pain and the skin over your hernia changes color. You develop a fever. This information is not intended to replace advice given to you by your health care provider. Make sure you discuss any questions you have with your health care  provider. Document Released: 06/26/2015 Document Revised: 09/27/2015 Document Reviewed: 06/26/2015 Elsevier Interactive Patient Education  [employer].

## 2023-02-22 NOTE — Progress Notes (Signed)
 02/22/2023  History of Present Illness: Cassie Walters is a 83 y.o. female presenting for follow up of an umbilical hernia and left lower quadrant pain.  The patient was last seen on 06/16/21.  She had had a CT scan on 06/07/21 which showed an umbilical hernia but no issues in the left lower quadrant.  She declined surgery as the hernia itself had otherwise not caused any significant issues.  Today, she reports that she had another CT scan on 01/20/23 which showed a stable umbilical hernia, but also concerns for dilated small bowel loops concerning for partial SBO vs ileus.  She was advised to see me for further evaluation.  She denies any current constipation and has bowel movements.  She is on lactulose  for this.  Reports her pain still is located in the left lower quadrant in the groin area and less so at the umbilicus.  Pain does not radiate.  Denies nausea or vomiting.  She reports she's interested in surgery for her hernia.  Past Medical History: Past Medical History:  Diagnosis Date   Closed fracture of phalanx of foot 01/25/2017   High cholesterol    Hypertension    Low bone density      Past Surgical History: Past Surgical History:  Procedure Laterality Date   ABDOMINAL HYSTERECTOMY     BUNIONECTOMY Bilateral    CHOLECYSTECTOMY      Home Medications: Prior to Admission medications   Medication Sig Start Date End Date Taking? Authorizing Provider  alendronate (FOSAMAX) 70 MG tablet Take 70 mg by mouth once a week.  12/04/14  Yes [provider]  amLODipine (NORVASC) 5 MG tablet Take 5 mg by mouth daily.   Yes [provider]  cetirizine (ZYRTEC) 10 MG tablet Take 10 mg by mouth daily.   Yes [provider]  cholecalciferol (VITAMIN D) 25 MCG (1000 UNIT) tablet Take 1,000 Units by mouth daily. 03/30/21  Yes [provider]  clotrimazole (LOTRIMIN) 1 % cream  01/13/17  Yes [provider]  diclofenac (VOLTAREN) 50 MG EC tablet diclofenac  sodium 50 mg tablet,delayed release   Yes [provider]  fluticasone (FLONASE) 50 MCG/ACT nasal spray Place into both nostrils. 02/04/21  Yes [provider]  HYDROcodone -acetaminophen  (NORCO/VICODIN) 5-325 MG tablet Take 1 tablet by mouth every 6 (six) hours as needed for moderate pain. 04/15/22  Yes Fisher, Rufino Coulter, PA-C  lactulose  (CHRONULAC ) 10 GM/15ML solution Take 45 mLs (30 g total) by mouth daily as needed for mild constipation. 01/15/15  Yes Bart Lieu, MD  olmesartan (BENICAR) 20 MG tablet Take 20 mg by mouth 1 day or 1 dose.   Yes [provider]  omeprazole (PRILOSEC) 20 MG capsule Take 20 mg by mouth daily. 04/02/21  Yes [provider]  polyethylene glycol powder (GLYCOLAX/MIRALAX) powder  01/11/17  Yes [provider]  pravastatin (PRAVACHOL) 20 MG tablet Take 20 mg by mouth daily.   Yes [provider]  tiZANidine (ZANAFLEX) 2 MG tablet Take by mouth every 6 (six) hours as needed for muscle spasms.   Yes [provider]  Turmeric (QC TUMERIC COMPLEX PO) Take by mouth.   Yes [provider]    Allergies: Allergies  Allergen Reactions   Hydrocodone     Latex    Lisinopril Cough   Oxycodone     Sulfa Antibiotics     Review of Systems: Review of Systems  Constitutional:  Negative for chills and fever.  Respiratory:  Negative for shortness of  breath.   Cardiovascular:  Negative for chest pain.  Gastrointestinal:  Positive for abdominal pain. Negative for constipation, nausea and vomiting.    Physical Exam BP (!) 150/73   Pulse 99   Temp 98.2 F (36.8 C)   Ht 5\' 4"  (1.626 m)   Wt 139 lb (63 kg)   SpO2 99%   BMI 23.86 kg/m  CONSTITUTIONAL: No acute distress HEENT:  Normocephalic, atraumatic, extraocular motion intact. RESPIRATORY:  Normal respiratory effort without pathologic use of accessory muscles. CARDIOVASCULAR: Regular rhythm and rate. GI: The abdomen is soft, non-distended, with  some discomfort to palpation in the left groin.  Cannot feel a discernible hernia in the left groin or the right groin.  The patient does have a stable 3 cm umbilical hernia that is reducible.  NEUROLOGIC:  Motor and sensation is grossly normal.  Cranial nerves are grossly intact. PSYCH:  Alert and oriented to person, place and time. Affect is normal.  Labs/Imaging: CT abdomen/pelvis on 01/20/23: IMPRESSION: 1. Left-sided nonobstructing renal stones. 2. Periumbilical hernia containing omental fat and loop of small bowel. 3. Dilated small bowel loops in the right midabdomen with stoolization of small bowel contents. Findings suggest partial or early small bowel obstruction versus ileus. 4. Aortic Atherosclerosis (ICD10-I70.0).  Assessment and Plan: This is a 83 y.o. female with umbilical hernia and left lower quadrant pain.  --Discussed with the patient the findings on her CT scan.  She overall continues to have bowel movements, so do not think there's really any concern for SBO.  Her main issue is the umbilical hernia which does not have significant symptoms, and the left lower quadrant/groin discomfort.  Cannot discern a specific hernia defect in either groin at this point.   --The patient is interest in surgical management of her umbilical hernia.  Discussed with her the option for possible diagnostic laparoscopy and open umbilical hernia repair.  She would like to wait until the warmer months to do surgery.  --Patient will follow up with me around April to re-evaluate her symptoms and exam, and then decide on surgery and scheduling.  I spent 20 minutes dedicated to the care of this patient on the date of this encounter to include pre-visit review of records, face-to-face time with the patient discussing diagnosis and management, and any post-visit coordination of care.   Marene Shape, MD [facility]

## 2023-04-24 IMAGING — CT CT ABD-PELV W/ CM
2 of 5 series · 16 of 46 positions shown, 18 images · IV contrast (APPLIED)
Comparison: 04/01/2017

CLINICAL DATA: Left lower quadrant pain. History of hernia.
Evaluate for umbilical hernia.

EXAM:
CT ABDOMEN AND PELVIS WITH CONTRAST
TECHNIQUE: Multidetector CT imaging of the abdomen and pelvis was performed
using the standard protocol following bolus administration of
intravenous contrast.

[Series 2: routine abd/pel with · axial · 0.78mm/px · z∈[-764,-364]mm · 13 of 91 slices shown, 15 images]
[im 6/91  soft-tissue]
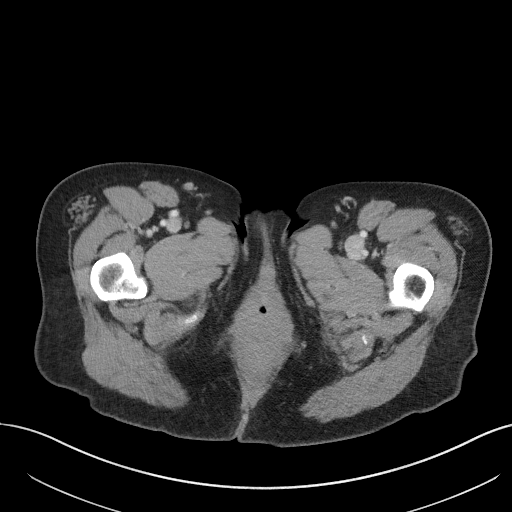
[im 6/91  bone]
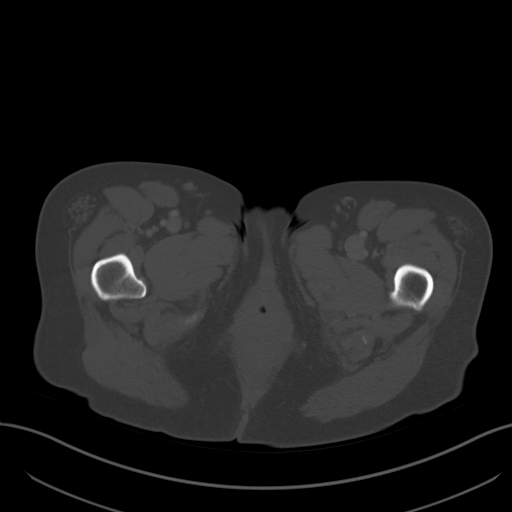
[im 11/91  soft-tissue]
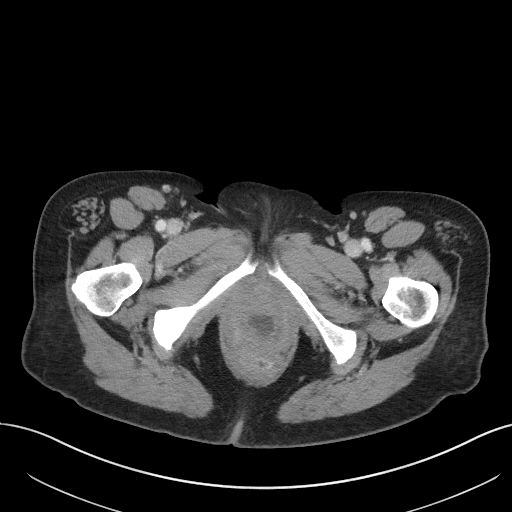
[im 21/91  soft-tissue]
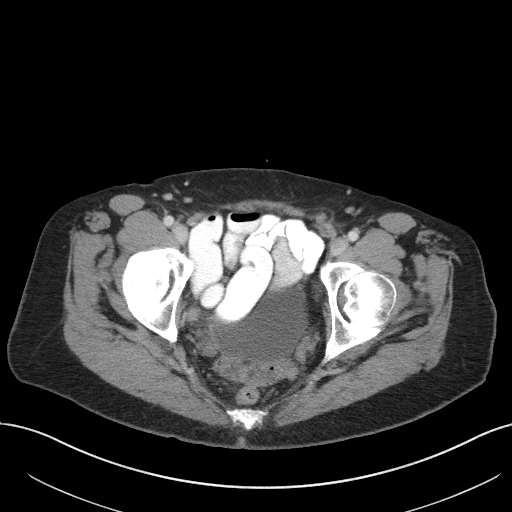
[im 26/91  soft-tissue]
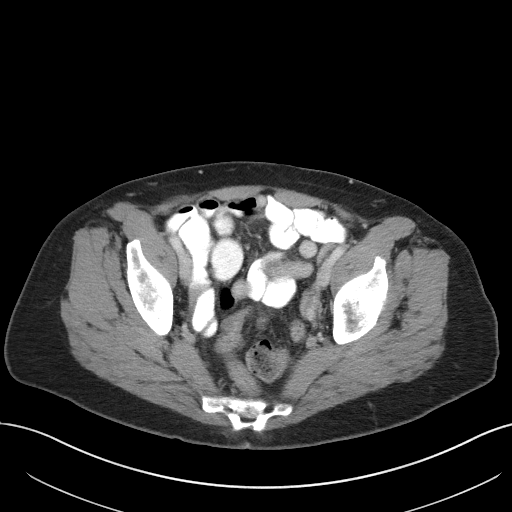
[im 31/91  soft-tissue]
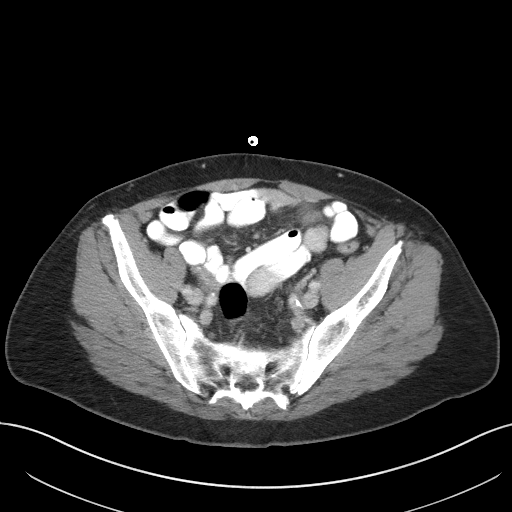
[im 41/91  soft-tissue]
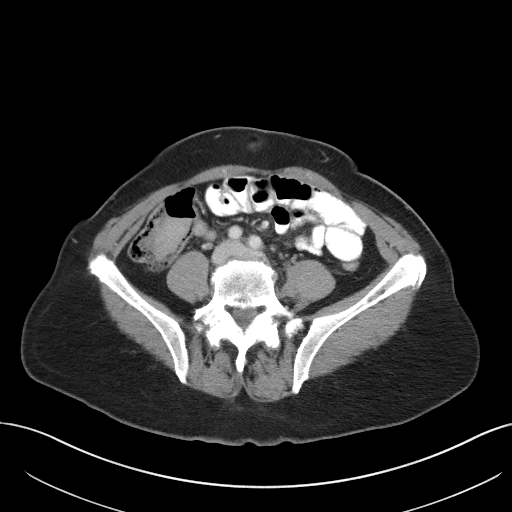
[im 46/91  soft-tissue]
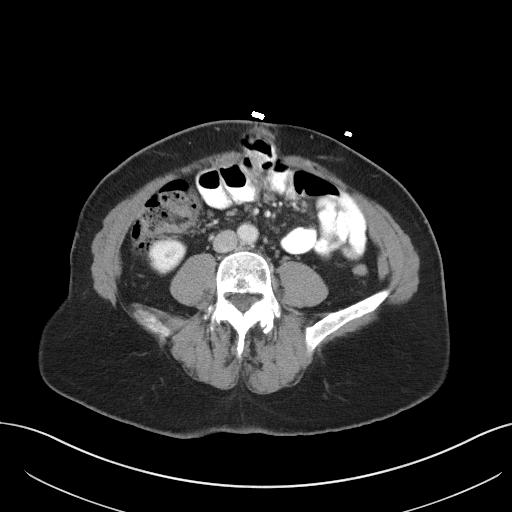
[im 51/91  soft-tissue]
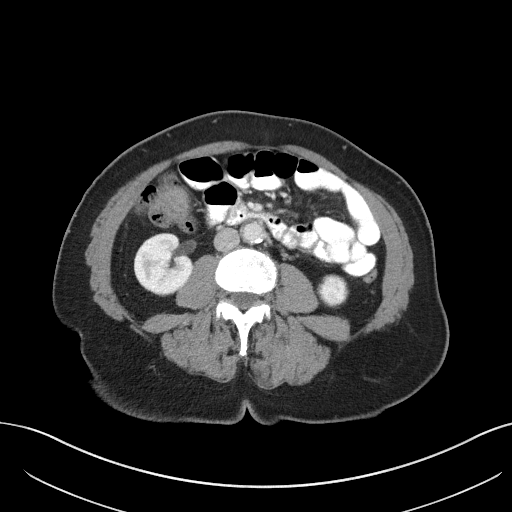
[im 61/91  soft-tissue]
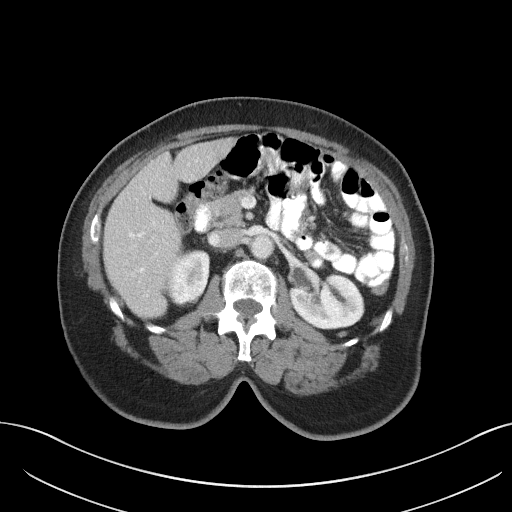
[im 61/91  bone]
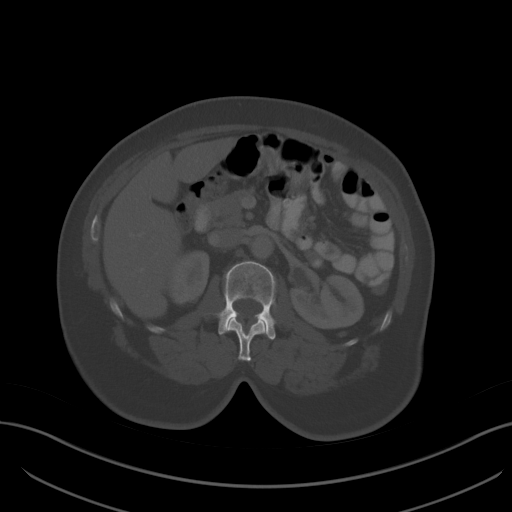
[im 66/91  soft-tissue]
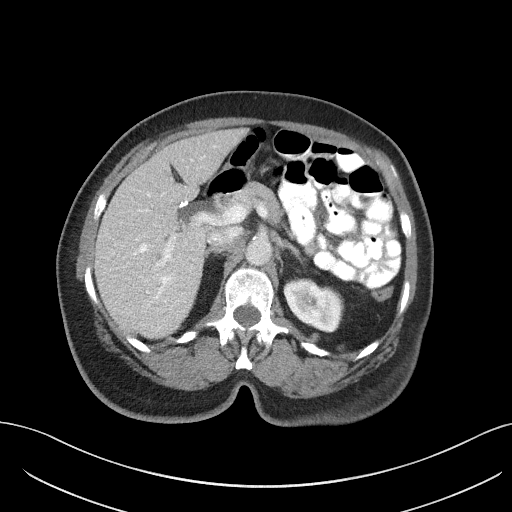
[im 71/91  soft-tissue]
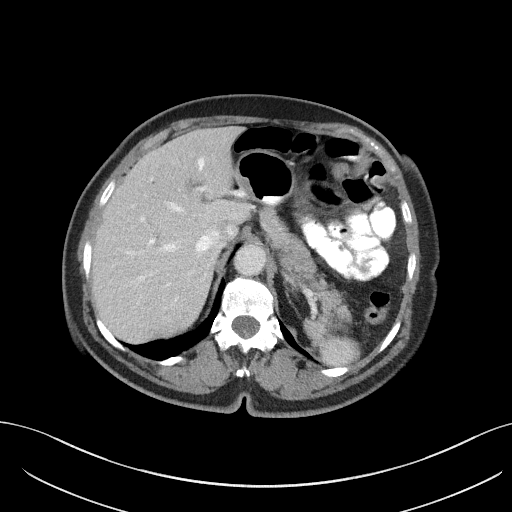
[im 81/91  soft-tissue]
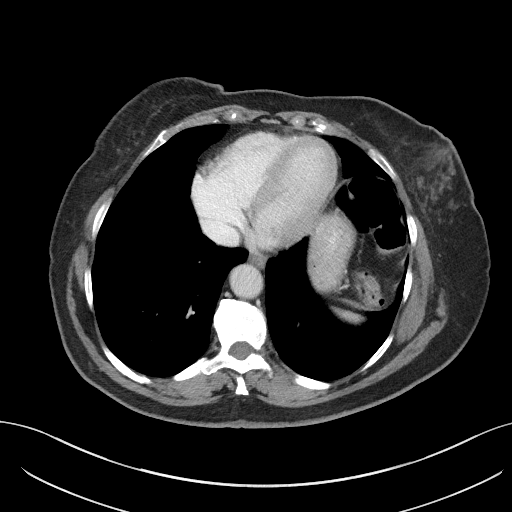
[im 86/91  soft-tissue]
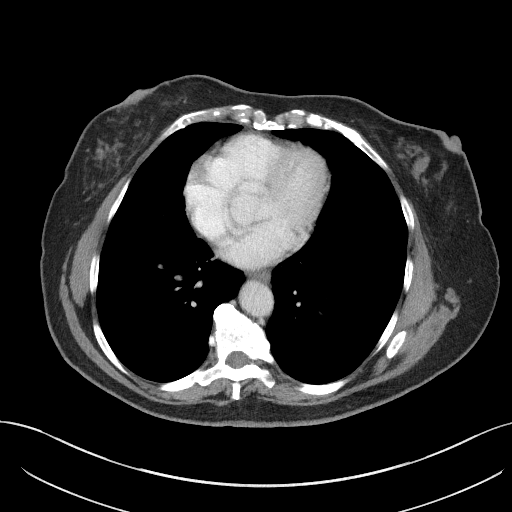

[Series 5: coronal st · coronal · 0.77mm/px · 3 of 91 slices shown]
[im 31/91  soft-tissue]
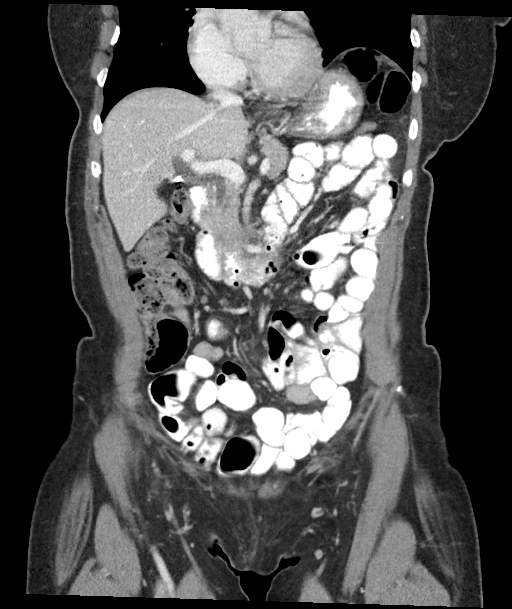
[im 41/91  soft-tissue]
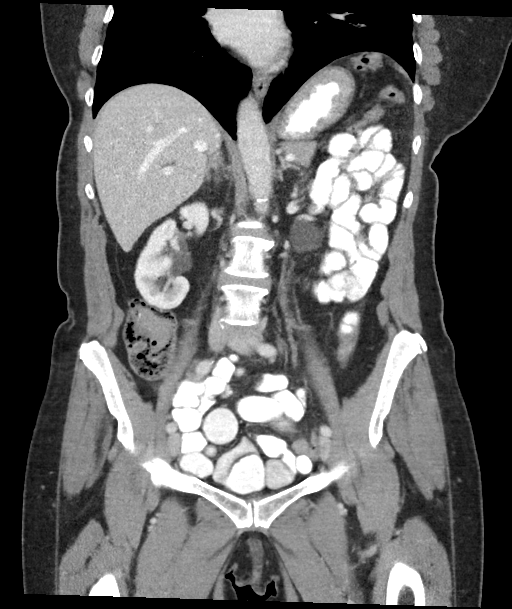
[im 51/91  soft-tissue]
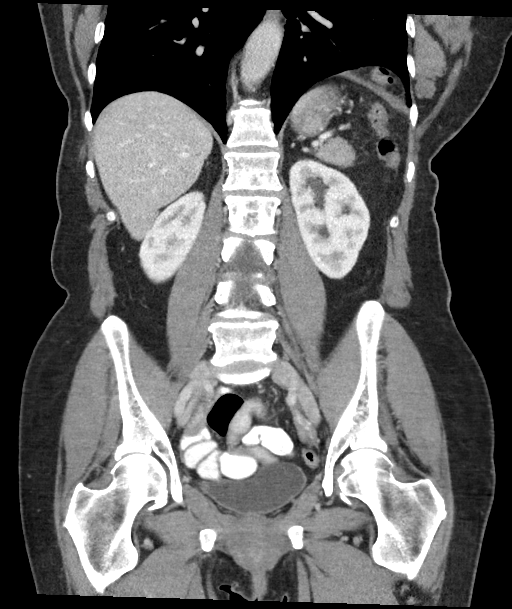

[16 of 46 positions shown; findings below may reference images not displayed]

RADIATION DOSE REDUCTION: This exam was performed according to the
departmental dose-optimization program which includes automated
exposure control, adjustment of the mA and/or kV according to
patient size and/or use of iterative reconstruction technique.

CONTRAST:  100mL OMNIPAQUE IOHEXOL 300 MG/ML  SOLN
FINDINGS: Lower chest: No acute abnormality

Hepatobiliary: No focal liver abnormality is seen. Status post
cholecystectomy. No biliary dilatation.

Pancreas: No focal abnormality or ductal dilatation.

Spleen: No focal abnormality.  Normal size.

Adrenals/Urinary Tract: Multiple left renal stones, the largest 4
mm. Fullness of the left renal pelvis, likely extrarenal pelvis. No
ureteral stones or hydronephrosis. Adrenal glands and urinary
bladder unremarkable.

Stomach/Bowel: Stomach, large and small bowel grossly unremarkable.
There is an umbilical hernia containing a small bowel loop. No
evidence of bowel obstruction. This is similar to prior study.

Vascular/Lymphatic: Aortic atherosclerosis. No evidence of aneurysm
or adenopathy.

Reproductive: Prior hysterectomy.  No adnexal masses.

Other: No free fluid or free air.

Musculoskeletal: No acute bony abnormality.
IMPRESSION: Umbilical hernia containing small bowel loops. No evidence of bowel
obstruction.

Left nephrolithiasis. No ureteral stones or hydronephrosis. Left
extrarenal pelvis.

Aortic atherosclerosis.

## 2023-05-09 DIAGNOSIS — J069 Acute upper respiratory infection, unspecified: Secondary | ICD-10-CM

## 2023-05-09 HISTORY — DX: Acute upper respiratory infection, unspecified: J06.9

## 2023-05-12 ENCOUNTER — Ambulatory Visit: Payer: Medicare Other | Admitting: Surgery

## 2023-05-24 ENCOUNTER — Encounter: Payer: Self-pay | Admitting: Surgery

## 2023-05-24 ENCOUNTER — Ambulatory Visit: Admitting: Surgery

## 2023-05-24 VITALS — BP 140/66 | HR 89 | Temp 98.1°F | Ht 64.0 in | Wt 136.0 lb

## 2023-05-24 DIAGNOSIS — R1032 Left lower quadrant pain: Secondary | ICD-10-CM

## 2023-05-24 DIAGNOSIS — K429 Umbilical hernia without obstruction or gangrene: Secondary | ICD-10-CM

## 2023-05-24 NOTE — Progress Notes (Signed)
 Request for Medical Clearance has been faxed to Dr Arn Beth at [facility].

## 2023-05-24 NOTE — Progress Notes (Signed)
 05/24/2023  History of Present Illness: Cassie Walters is a 83 y.o. female presenting for follow up of an umbilical hernia.  The patient had a repeat CT scan on 01/20/23 which showed a stable umbilical hernia containing loop of bowel without evidence of obstruction.  The hernia is about 3.2 cm cm.  The patient also has been having left groin pain which has continued.  She reports it's not any worse, but it also has not improved.  She continues having bowel function, without any nausea or vomiting.  She was last seen on 02/22/23 but wanted to wait until the warmer months to have surgery.  Of note, she has been battling a cold recently and has been having some lingering coughing/phlegm.  She was started on Levaquin by her PCP yesterday.  Past Medical History: Past Medical History:  Diagnosis Date   Closed fracture of phalanx of foot 01/25/2017   High cholesterol    Hypertension    Low bone density      Past Surgical History: Past Surgical History:  Procedure Laterality Date   ABDOMINAL HYSTERECTOMY     BUNIONECTOMY Bilateral    CHOLECYSTECTOMY      Home Medications: Prior to Admission medications   Medication Sig Start Date End Date Taking? Authorizing Provider  alendronate (FOSAMAX) 70 MG tablet Take 70 mg by mouth once a week.  12/04/14  Yes [provider]  amLODipine (NORVASC) 5 MG tablet Take 5 mg by mouth daily.   Yes [provider]  cetirizine (ZYRTEC) 10 MG tablet Take 10 mg by mouth daily.   Yes [provider]  cholecalciferol (VITAMIN D) 25 MCG (1000 UNIT) tablet Take 1,000 Units by mouth daily. 03/30/21  Yes [provider]  clotrimazole (LOTRIMIN) 1 % cream  01/13/17  Yes [provider]  diclofenac (VOLTAREN) 50 MG EC tablet diclofenac sodium 50 mg tablet,delayed release   Yes [provider]  fluticasone (FLONASE) 50 MCG/ACT nasal spray Place into both nostrils. 02/04/21  Yes [provider]   HYDROcodone-acetaminophen (NORCO/VICODIN) 5-325 MG tablet Take 1 tablet by mouth every 6 (six) hours as needed for moderate pain. 04/15/22  Yes Fisher, Roselyn Bering, PA-C  lactulose (CHRONULAC) 10 GM/15ML solution Take 45 mLs (30 g total) by mouth daily as needed for mild constipation. 01/15/15  Yes Rockne Menghini, MD  levofloxacin (LEVAQUIN) 500 MG tablet Take 500 mg by mouth daily. 05/23/23  Yes [provider]  olmesartan (BENICAR) 20 MG tablet Take 20 mg by mouth 1 day or 1 dose.   Yes [provider]  omeprazole (PRILOSEC) 20 MG capsule Take 20 mg by mouth daily. 04/02/21  Yes [provider]  polyethylene glycol powder (GLYCOLAX/MIRALAX) powder  01/11/17  Yes [provider]  pravastatin (PRAVACHOL) 20 MG tablet Take 20 mg by mouth daily.   Yes [provider]  tiZANidine (ZANAFLEX) 2 MG tablet Take by mouth every 6 (six) hours as needed for muscle spasms.   Yes [provider]  Turmeric (QC TUMERIC COMPLEX PO) Take by mouth.   Yes [provider]    Allergies: Allergies  Allergen Reactions   Hydrocodone    Latex    Lisinopril Cough   Oxycodone    Sulfa Antibiotics     Review of Systems: Review of Systems  Constitutional:  Negative for chills and fever.  Respiratory:  Positive for cough. Negative for shortness of breath.   Cardiovascular:  Negative for chest pain.  Gastrointestinal:  Positive for abdominal pain (  left groin, some umbilical). Negative for constipation, nausea and vomiting.  Genitourinary:  Negative for dysuria.  Musculoskeletal:  Negative for myalgias.  Skin:  Negative for rash.    Physical Exam BP (!) 140/66   Pulse 89   Temp 98.1 F (36.7 C)   Ht 5\' 4"  (1.626 m)   Wt 136 lb (61.7 kg)   SpO2 99%   BMI 23.34 kg/m  CONSTITUTIONAL: No acute distress, well nourished. HEENT:  Normocephalic, atraumatic, extraocular motion intact. RESPIRATORY:  Has mild crackles on posterior auscultation, clear  lungs anteriorly. Normal respiratory effort without pathologic use of accessory muscles. CARDIOVASCULAR: Heart is regular without murmurs, gallops, or rubs. GI: The abdomen is soft, non-distended, with discomfort in the left groin.  The patient has a stable umbilical hernia which remains reducible.  In the left groin, no specific hernia defect is palpable, but the patient does have tenderness when pushing on the groin at the indirect space.   NEUROLOGIC:  Motor and sensation is grossly normal.  Cranial nerves are grossly intact. PSYCH:  Alert and oriented to person, place and time. Affect is normal.  Labs/Imaging: CT abdomen/pelvis on 01/20/23: IMPRESSION: 1. Left-sided nonobstructing renal stones. 2. Periumbilical hernia containing omental fat and loop of small bowel. 3. Dilated small bowel loops in the right midabdomen with stoolization of small bowel contents. Findings suggest partial or early small bowel obstruction versus ileus. 4. Aortic Atherosclerosis (ICD10-I70.0).   Assessment and Plan: This is a 83 y.o. female with a reducible umbilical hernia and left groin pain.  --Discussed with the patient that her umbilical hernia remains stable.  On personal view of the CT scan images, the left groin area is a bit different compared to the right side, and there could be an early hernia forming.  It is unclear on exam at least, but the patient continues having pain there.  Discussed with her that we can proceed with repair of her umbilical hernia, but we can also assess the left groin at the same time.  She is in agreement. --Discussed with her then the plan for a diagnostic laparoscopy to evaluate for left groin pain, if hernia is seen then a robotic assisted left inguinal hernia repair, and then an open umbilical hernia repair.  Reviewed the surgery at length with her including the planned incisions, the risks of bleeding, infection, injury to surrounding structures, the use of mesh for the  repairs, that this would be an outpatient surgery, post-operative activity restrictions, pain control, and she's willing to proceed. --Given her cough, will give her some time to continue recovering.  Will schedule her for surgery on 06/08/23.  Will also send for medical clearance.  All of her questions have been answered.  I spent 30 minutes dedicated to the care of this patient on the date of this encounter to include pre-visit review of records, face-to-face time with the patient discussing diagnosis and management, and any post-visit coordination of care.   Marene Shape, MD [facility]

## 2023-05-24 NOTE — H&P (View-Only) (Signed)
 05/24/2023  History of Present Illness: Cassie Walters is a 83 y.o. female presenting for follow up of an umbilical hernia.  The patient had a repeat CT scan on 01/20/23 which showed a stable umbilical hernia containing loop of bowel without evidence of obstruction.  The hernia is about 3.2 cm cm.  The patient also has been having left groin pain which has continued.  She reports it's not any worse, but it also has not improved.  She continues having bowel function, without any nausea or vomiting.  She was last seen on 02/22/23 but wanted to wait until the warmer months to have surgery.  Of note, she has been battling a cold recently and has been having some lingering coughing/phlegm.  She was started on Levaquin by her PCP yesterday.  Past Medical History: Past Medical History:  Diagnosis Date   Closed fracture of phalanx of foot 01/25/2017   High cholesterol    Hypertension    Low bone density      Past Surgical History: Past Surgical History:  Procedure Laterality Date   ABDOMINAL HYSTERECTOMY     BUNIONECTOMY Bilateral    CHOLECYSTECTOMY      Home Medications: Prior to Admission medications   Medication Sig Start Date End Date Taking? Authorizing Provider  alendronate (FOSAMAX) 70 MG tablet Take 70 mg by mouth once a week.  12/04/14  Yes [provider]  amLODipine (NORVASC) 5 MG tablet Take 5 mg by mouth daily.   Yes [provider]  cetirizine (ZYRTEC) 10 MG tablet Take 10 mg by mouth daily.   Yes [provider]  cholecalciferol (VITAMIN D) 25 MCG (1000 UNIT) tablet Take 1,000 Units by mouth daily. 03/30/21  Yes [provider]  clotrimazole (LOTRIMIN) 1 % cream  01/13/17  Yes [provider]  diclofenac (VOLTAREN) 50 MG EC tablet diclofenac sodium 50 mg tablet,delayed release   Yes [provider]  fluticasone (FLONASE) 50 MCG/ACT nasal spray Place into both nostrils. 02/04/21  Yes [provider]   HYDROcodone-acetaminophen (NORCO/VICODIN) 5-325 MG tablet Take 1 tablet by mouth every 6 (six) hours as needed for moderate pain. 04/15/22  Yes Fisher, Roselyn Bering, PA-C  lactulose (CHRONULAC) 10 GM/15ML solution Take 45 mLs (30 g total) by mouth daily as needed for mild constipation. 01/15/15  Yes Rockne Menghini, MD  levofloxacin (LEVAQUIN) 500 MG tablet Take 500 mg by mouth daily. 05/23/23  Yes [provider]  olmesartan (BENICAR) 20 MG tablet Take 20 mg by mouth 1 day or 1 dose.   Yes [provider]  omeprazole (PRILOSEC) 20 MG capsule Take 20 mg by mouth daily. 04/02/21  Yes [provider]  polyethylene glycol powder (GLYCOLAX/MIRALAX) powder  01/11/17  Yes [provider]  pravastatin (PRAVACHOL) 20 MG tablet Take 20 mg by mouth daily.   Yes [provider]  tiZANidine (ZANAFLEX) 2 MG tablet Take by mouth every 6 (six) hours as needed for muscle spasms.   Yes [provider]  Turmeric (QC TUMERIC COMPLEX PO) Take by mouth.   Yes [provider]    Allergies: Allergies  Allergen Reactions   Hydrocodone    Latex    Lisinopril Cough   Oxycodone    Sulfa Antibiotics     Review of Systems: Review of Systems  Constitutional:  Negative for chills and fever.  Respiratory:  Positive for cough. Negative for shortness of breath.   Cardiovascular:  Negative for chest pain.  Gastrointestinal:  Positive for abdominal pain (  left groin, some umbilical). Negative for constipation, nausea and vomiting.  Genitourinary:  Negative for dysuria.  Musculoskeletal:  Negative for myalgias.  Skin:  Negative for rash.    Physical Exam BP (!) 140/66   Pulse 89   Temp 98.1 F (36.7 C)   Ht 5\' 4"  (1.626 m)   Wt 136 lb (61.7 kg)   SpO2 99%   BMI 23.34 kg/m  CONSTITUTIONAL: No acute distress, well nourished. HEENT:  Normocephalic, atraumatic, extraocular motion intact. RESPIRATORY:  Has mild crackles on posterior auscultation, clear  lungs anteriorly. Normal respiratory effort without pathologic use of accessory muscles. CARDIOVASCULAR: Heart is regular without murmurs, gallops, or rubs. GI: The abdomen is soft, non-distended, with discomfort in the left groin.  The patient has a stable umbilical hernia which remains reducible.  In the left groin, no specific hernia defect is palpable, but the patient does have tenderness when pushing on the groin at the indirect space.   NEUROLOGIC:  Motor and sensation is grossly normal.  Cranial nerves are grossly intact. PSYCH:  Alert and oriented to person, place and time. Affect is normal.  Labs/Imaging: CT abdomen/pelvis on 01/20/23: IMPRESSION: 1. Left-sided nonobstructing renal stones. 2. Periumbilical hernia containing omental fat and loop of small bowel. 3. Dilated small bowel loops in the right midabdomen with stoolization of small bowel contents. Findings suggest partial or early small bowel obstruction versus ileus. 4. Aortic Atherosclerosis (ICD10-I70.0).   Assessment and Plan: This is a 83 y.o. female with a reducible umbilical hernia and left groin pain.  --Discussed with the patient that her umbilical hernia remains stable.  On personal view of the CT scan images, the left groin area is a bit different compared to the right side, and there could be an early hernia forming.  It is unclear on exam at least, but the patient continues having pain there.  Discussed with her that we can proceed with repair of her umbilical hernia, but we can also assess the left groin at the same time.  She is in agreement. --Discussed with her then the plan for a diagnostic laparoscopy to evaluate for left groin pain, if hernia is seen then a robotic assisted left inguinal hernia repair, and then an open umbilical hernia repair.  Reviewed the surgery at length with her including the planned incisions, the risks of bleeding, infection, injury to surrounding structures, the use of mesh for the  repairs, that this would be an outpatient surgery, post-operative activity restrictions, pain control, and she's willing to proceed. --Given her cough, will give her some time to continue recovering.  Will schedule her for surgery on 06/08/23.  Will also send for medical clearance.  All of her questions have been answered.  I spent 30 minutes dedicated to the care of this patient on the date of this encounter to include pre-visit review of records, face-to-face time with the patient discussing diagnosis and management, and any post-visit coordination of care.   Marene Shape, MD [facility]

## 2023-05-24 NOTE — Patient Instructions (Signed)
 You have requested for your Umbilical Hernia be repaired and also we will do a diagnostic laparoscopy to see if there is an additional hernia. This will be scheduled with Dr. Aleen Campi at [facility].   If you are on any injectable weight loss medication, you will need to stop taking your GLP-1 injectable (weight loss) medications 8 days before your surgery to avoid any complications with anesthesia.   Please see your (blue)pre-care sheet for information. Our surgery scheduler will call you to verify surgery date and to go over information.   You will need to arrange to be off work for 1-2 weeks but will have to have a lifting restriction of no more than 15 lbs for 6 weeks following your surgery. If you have FMLA or disability paperwork that needs filled out you may drop this off at our office or this can be faxed to [phone].     Umbilical Hernia, Adult A hernia is a bulge of tissue that pushes through an opening between muscles. An umbilical hernia happens in the abdomen, near the belly button (umbilicus). The hernia may contain tissues from the small intestine, large intestine, or fatty tissue covering the intestines (omentum). Umbilical hernias in adults tend to get worse over time, and they require surgical treatment. There are several types of umbilical hernias. You may have: A hernia located just above or below the umbilicus (indirect hernia). This is the most common type of umbilical hernia in adults. A hernia that forms through an opening formed by the umbilicus (direct hernia). A hernia that comes and goes (reducible hernia). A reducible hernia may be visible only when you strain, lift something heavy, or cough. This type of hernia can be pushed back into the abdomen (reduced). A hernia that traps abdominal tissue inside the hernia (incarcerated hernia). This type of hernia cannot be reduced. A hernia that cuts off blood flow to the tissues inside the hernia  (strangulated hernia). The tissues can start to die if this happens. This type of hernia requires emergency treatment.  What are the causes? An umbilical hernia happens when tissue inside the abdomen presses on a weak area of the abdominal muscles. What increases the risk? You may have a greater risk of this condition if you: Are obese. Have had several pregnancies. Have a buildup of fluid inside your abdomen (ascites). Have had surgery that weakens the abdominal muscles.  What are the signs or symptoms? The main symptom of this condition is a painless bulge at or near the belly button. A reducible hernia may be visible only when you strain, lift something heavy, or cough. Other symptoms may include: Dull pain. A feeling of pressure.  Symptoms of a strangulated hernia may include: Pain that gets increasingly worse. Nausea and vomiting. Pain when pressing on the hernia. Skin over the hernia becoming red or purple. Constipation. Blood in the stool.  How is this diagnosed? This condition may be diagnosed based on: A physical exam. You may be asked to cough or strain while standing. These actions increase the pressure inside your abdomen and force the hernia through the opening in your muscles. Your health care provider may try to reduce the hernia by pressing on it. Your symptoms and medical history.  How is this treated? Surgery is the only treatment for an umbilical hernia. Surgery for a strangulated hernia is done as soon as possible. If you have a small hernia that is not incarcerated, you may need to lose weight before  having surgery. Follow these instructions at home: Lose weight, if told by your health care provider. Do not try to push the hernia back in. Watch your hernia for any changes in color or size. Tell your health care provider if any changes occur. You may need to avoid activities that increase pressure on your hernia. Do not lift anything that is heavier than 10 lb  (4.5 kg) until your health care provider says that this is safe. Take over-the-counter and prescription medicines only as told by your health care provider. Keep all follow-up visits as told by your health care provider. This is important. Contact a health care provider if: Your hernia gets larger. Your hernia becomes painful. Get help right away if: You develop sudden, severe pain near the area of your hernia. You have pain as well as nausea or vomiting. You have pain and the skin over your hernia changes color. You develop a fever.   Laparoscopic Surgery Using A Robot: What to Expect Laparoscopic surgery using a robot is a procedure that allows a surgeon to use a robot to control tools and a camera. Unlike traditional (open) surgery, in which a large cut (incision) is made, this type of surgery uses a few small cuts. This is also called minimally invasive surgery. During the surgery, a thin scope with a camera on the end (laparoscope) is inserted through one of the cuts. The image from the camera is shown on a monitor to help the surgeon see inside the body. This type of surgery is commonly used for: Prostate surgery. Surgery on the uterus or ovaries. Kidney surgery. Colon surgery. Hernia repair surgery. Surgery for weight loss (bariatric surgery). Tell a health care provider about: Any allergies you have. All medicines you're taking, including vitamins, herbs, eye drops, creams, and over-the-counter medicines. Any problems you or family members have had with anesthesia. Any bleeding problems you have. Any surgeries you've had. Any medical conditions you have. Whether you're pregnant or may be pregnant. Your use of tobacco or nicotine products. Your use of alcohol or drugs. What are the risks of this type of surgery? Your health care provider will talk with you about risks. These may include: Infection. Bleeding. Damage to nearby structures or organs. Allergic reactions to  medicines or dyes. Pain, bruising, and swelling. Other side effects may depend on the type of surgery that's done. What are the benefits? This type of surgery also has benefits. Because the cuts are small, this may result in: A shorter hospital stay. Faster healing time. Less blood loss. Less scarring. Less pain. In some cases, this type of surgery may also take less time to do than regular, open surgery. What happens before the surgery? Your provider will go over the procedures that will take place. Ask your provider: What is the goal of the surgery? Is this surgery the best treatment option? What other options do I have? How much experience does the surgeon have with this type of surgery? How long will surgery take? How long will it take to recover from surgery? What are the costs, and how do they compare to other surgical choices? How should I prepare for surgery? What happens during the surgery? There are differences between surgeries done with a robot and those done without a robot. These include: During open surgery, a large cut is made. This allows the surgeon to access the organ or body part that they are operating on. The surgeon holds the surgical tools in their hands. During a  laparoscopic procedure, a few small cuts are made. A camera is inserted so the surgeon can see the body part. The surgeon holds the tools in their hands to reach the body part through the small cuts. During a laparoscopic procedure that uses a robot, a few small cuts are made. The surgeon sits at a console in the operating room. The surgeon uses the controls at the console to move the robotic arms. The tools of surgery and laparoscope are attached to the arms of the robot. Ask your provider about any concerns you may have. What can I expect after the surgery? After your procedure, it's common to have: Mild discomfort in the belly. You may have pain, bruising, or swelling in the areas where cuts were  made. Bloating. Shoulder pain. This comes from the gas that was used during the procedure. Sore throat. Follow these instructions at home: Rest as told. Get up to take short walks at least every 2 hours during the day. This helps you breathe better and keeps your blood flowing. Ask for help if you feel weak or unsteady. Drink more fluids as told. Do not smoke, vape, or use nicotine or tobacco. This information is not intended to replace advice given to you by your health care provider. Make sure you discuss any questions you have with your health care provider. Document Revised: 06/03/2022 Document Reviewed: 06/03/2022 Elsevier Patient Education  2024 [employer].

## 2023-05-25 ENCOUNTER — Telehealth: Payer: Self-pay | Admitting: Surgery

## 2023-05-25 NOTE — Telephone Encounter (Signed)
 Patient has been advised of Pre-Admission date/time, and Surgery date at [facility].  Surgery Date: 06/08/23 Preadmission Testing Date: 05/31/23 (phone 8a-1p)  Patient informed of the scheduling process and surgery information given at time of office visit.  Patient has been made aware to call [phone], between 1-3:00pm the day before surgery, to find out what time to arrive for surgery.

## 2023-05-31 ENCOUNTER — Encounter
Admission: RE | Admit: 2023-05-31 | Discharge: 2023-05-31 | Disposition: A | Discharge status: Home / Self Care | Source: Ambulatory Visit | Attending: Surgery | Admitting: Surgery

## 2023-05-31 ENCOUNTER — Other Ambulatory Visit: Payer: Self-pay

## 2023-05-31 DIAGNOSIS — Z01812 Encounter for preprocedural laboratory examination: Secondary | ICD-10-CM

## 2023-05-31 DIAGNOSIS — Z01810 Encounter for preprocedural cardiovascular examination: Secondary | ICD-10-CM

## 2023-05-31 DIAGNOSIS — I10 Essential (primary) hypertension: Secondary | ICD-10-CM

## 2023-05-31 HISTORY — DX: Umbilical hernia without obstruction or gangrene: K42.9

## 2023-05-31 HISTORY — DX: Gastro-esophageal reflux disease without esophagitis: K21.9

## 2023-05-31 NOTE — Patient Instructions (Signed)
 Your procedure is scheduled on:06-08-23 Thursday Report to the Registration Desk on the 1st floor of the [facility].Then proceed to the 2nd floor Surgery Desk To find out your arrival time, please call [phone] between 1PM - 3PM on:06-07-23 Wednesday If your arrival time is 6:00 am, do not arrive before that time as the [facility] entrance doors do not open until 6:00 am.  REMEMBER: Instructions that are not followed completely may result in serious medical risk, up to and including death; or upon the discretion of your surgeon and anesthesiologist your surgery may need to be rescheduled.  Do not eat food after midnight the night before surgery.  No gum chewing or hard candies.  You may however, drink CLEAR liquids up to 2 hours before you are scheduled to arrive for your surgery. Do not drink anything within 2 hours of your scheduled arrival time.  Clear liquids include: - water  - apple juice without pulp - gatorade (not RED colors) - black coffee or tea (Do NOT add milk or creamers to the coffee or tea) Do NOT drink anything that is not on this list.  One week prior to surgery:Stop NOW (05-31-23) Stop Anti-inflammatories (NSAIDS) such as Advil, Aleve, Ibuprofen, Motrin, Naproxen, Naprosyn and Aspirin based products such as Excedrin, Goody's Powder, BC Powder. Stop ANY OVER THE COUNTER supplements until after surgery (Vitamin D)  You may however, continue to take Tylenol  if needed for pain up until the day of surgery  Continue taking all of your other prescription medications up until the day of surgery.  ON THE DAY OF SURGERY ONLY TAKE THESE MEDICATIONS WITH SIPS OF WATER: -amLODipine (NORVASC)  -omeprazole (PRILOSEC)   No Alcohol for 24 hours before or after surgery.  No Smoking including e-cigarettes for 24 hours before surgery.  No chewable tobacco products for at least 6 hours before surgery.  No nicotine patches on the day of surgery.  Do not use any "recreational"  drugs for at least a week (preferably 2 weeks) before your surgery.  Please be advised that the combination of cocaine and anesthesia may have negative outcomes, up to and including death. If you test positive for cocaine, your surgery will be cancelled.  On the morning of surgery brush your teeth with toothpaste and water, you may rinse your mouth with mouthwash if you wish. Do not swallow any toothpaste or mouthwash.  Use CHG Soap as directed on instruction sheet.  Do not wear jewelry, make-up, hairpins, clips or nail polish.  For welded (permanent) jewelry: bracelets, anklets, waist bands, etc.  Please have this removed prior to surgery.  If it is not removed, there is a chance that hospital personnel will need to cut it off on the day of surgery.  Do not wear lotions, powders, or perfumes.   Do not shave body hair from the neck down 48 hours before surgery.  Contact lenses, hearing aids and dentures may not be worn into surgery.  Do not bring valuables to the hospital. [facility] is not responsible for any missing/lost belongings or valuables.   Notify your doctor if there is any change in your medical condition (cold, fever, infection).  Wear comfortable clothing (specific to your surgery type) to the hospital.  After surgery, you can help prevent lung complications by doing breathing exercises.  Take deep breaths and cough every 1-2 hours. Your doctor may order a device called an Incentive Spirometer to help you take deep breaths. When coughing or sneezing, hold a  pillow firmly against your incision with both hands. This is called "splinting." Doing this helps protect your incision. It also decreases belly discomfort.  If you are being admitted to the hospital overnight, leave your suitcase in the car. After surgery it may be brought to your room.  In case of increased patient census, it may be necessary for you, the patient, to continue your postoperative care in the Same Day  Surgery department.  If you are being discharged the day of surgery, you will not be allowed to drive home. You will need a responsible individual to drive you home and stay with you for 24 hours after surgery.   If you are taking public transportation, you will need to have a responsible individual with you.  Please call the Pre-admissions Testing Dept. at [phone] if you have any questions about these instructions.  Surgery Visitation Policy:  Patients having surgery or a procedure may have two visitors.  Children under the age of 58 must have an adult with them who is not the patient.     Preparing for Surgery with CHLORHEXIDINE GLUCONATE (CHG) Soap  Chlorhexidine Gluconate (CHG) Soap  o An antiseptic cleaner that kills germs and bonds with the skin to continue killing germs even after washing  o Used for showering the night before surgery and morning of surgery  Before surgery, you can play an important role by reducing the number of germs on your skin.  CHG (Chlorhexidine gluconate) soap is an antiseptic cleanser which kills germs and bonds with the skin to continue killing germs even after washing.  Please do not use if you have an allergy to CHG or antibacterial soaps. If your skin becomes reddened/irritated stop using the CHG.  1. Shower the NIGHT BEFORE SURGERY and the MORNING OF SURGERY with CHG soap.  2. If you choose to wash your hair, wash your hair first as usual with your normal shampoo.  3. After shampooing, rinse your hair and body thoroughly to remove the shampoo.  4. Use CHG as you would any other liquid soap. You can apply CHG directly to the skin and wash gently with a scrungie or a clean washcloth.  5. Apply the CHG soap to your body only from the neck down. Do not use on open wounds or open sores. Avoid contact with your eyes, ears, mouth, and genitals (private parts). Wash face and genitals (private parts) with your normal soap.  6. Wash  thoroughly, paying special attention to the area where your surgery will be performed.  7. Thoroughly rinse your body with warm water.  8. Do not shower/wash with your normal soap after using and rinsing off the CHG soap.  9. Pat yourself dry with a clean towel.  10. Wear clean pajamas to bed the night before surgery.  12. Place clean sheets on your bed the night of your first shower and do not sleep with pets.  13. Shower again with the CHG soap on the day of surgery prior to arriving at the hospital.  14. Do not apply any deodorants/lotions/powders.  15. Please wear clean clothes to the hospital.

## 2023-06-01 ENCOUNTER — Encounter
Admission: RE | Admit: 2023-06-01 | Discharge: 2023-06-01 | Disposition: A | Discharge status: Home / Self Care | Source: Ambulatory Visit | Attending: Surgery | Admitting: Surgery

## 2023-06-01 ENCOUNTER — Encounter: Payer: Self-pay | Admitting: Urgent Care

## 2023-06-01 DIAGNOSIS — Z01810 Encounter for preprocedural cardiovascular examination: Secondary | ICD-10-CM

## 2023-06-01 DIAGNOSIS — I10 Essential (primary) hypertension: Secondary | ICD-10-CM | POA: Diagnosis not present

## 2023-06-01 DIAGNOSIS — Z01812 Encounter for preprocedural laboratory examination: Secondary | ICD-10-CM

## 2023-06-01 DIAGNOSIS — Z01818 Encounter for other preprocedural examination: Secondary | ICD-10-CM | POA: Diagnosis present

## 2023-06-01 LAB — CBC
HCT: 39.2 % (ref 36.0–46.0)
Hemoglobin: 13.1 g/dL (ref 12.0–15.0)
MCH: 29.4 pg (ref 26.0–34.0)
MCHC: 33.4 g/dL (ref 30.0–36.0)
MCV: 87.9 fL (ref 80.0–100.0)
Platelets: 264 10*3/uL (ref 150–400)
RBC: 4.46 MIL/uL (ref 3.87–5.11)
RDW: 13.9 % (ref 11.5–15.5)
WBC: 6.8 10*3/uL (ref 4.0–10.5)
nRBC: 0 % (ref 0.0–0.2)

## 2023-06-01 LAB — BASIC METABOLIC PANEL WITH GFR
Anion gap: 8 (ref 5–15)
BUN: 18 mg/dL (ref 8–23)
CO2: 26 mmol/L (ref 22–32)
Calcium: 9.7 mg/dL (ref 8.9–10.3)
Chloride: 104 mmol/L (ref 98–111)
Creatinine, Ser: 0.93 mg/dL (ref 0.44–1.00)
GFR, Estimated: >60 mL/min (ref >60)
Glucose, Bld: 92 mg/dL (ref 70–99)
Potassium: 3.5 mmol/L (ref 3.5–5.1)
Sodium: 138 mmol/L (ref 135–145)

## 2023-06-01 NOTE — Progress Notes (Unsigned)
 Medical Clearance has been received from Dr Arn Beth. The patient is cleared at Low risk for surgery.

## 2023-06-08 ENCOUNTER — Encounter
Admission: RE | Disposition: A | Discharge status: Home / Self Care | Payer: Self-pay | Source: Home / Self Care | Attending: Surgery

## 2023-06-08 ENCOUNTER — Ambulatory Visit: Admitting: Registered Nurse

## 2023-06-08 ENCOUNTER — Encounter: Payer: Self-pay | Admitting: Surgery

## 2023-06-08 ENCOUNTER — Other Ambulatory Visit: Payer: Self-pay

## 2023-06-08 ENCOUNTER — Ambulatory Visit
Admission: RE | Admit: 2023-06-08 | Discharge: 2023-06-08 | Disposition: A | Discharge status: Home / Self Care | Attending: Surgery | Admitting: Surgery

## 2023-06-08 DIAGNOSIS — K4020 Bilateral inguinal hernia, without obstruction or gangrene, not specified as recurrent: Secondary | ICD-10-CM

## 2023-06-08 DIAGNOSIS — R1032 Left lower quadrant pain: Secondary | ICD-10-CM

## 2023-06-08 DIAGNOSIS — K219 Gastro-esophageal reflux disease without esophagitis: Secondary | ICD-10-CM | POA: Insufficient documentation

## 2023-06-08 DIAGNOSIS — D175 Benign lipomatous neoplasm of intra-abdominal organs: Secondary | ICD-10-CM | POA: Insufficient documentation

## 2023-06-08 DIAGNOSIS — K429 Umbilical hernia without obstruction or gangrene: Secondary | ICD-10-CM | POA: Diagnosis not present

## 2023-06-08 DIAGNOSIS — I10 Essential (primary) hypertension: Secondary | ICD-10-CM | POA: Diagnosis not present

## 2023-06-08 HISTORY — PX: LAPAROSCOPY: SHX197

## 2023-06-08 HISTORY — PX: UMBILICAL HERNIA REPAIR: SHX196

## 2023-06-08 HISTORY — PX: HERNIORRHAPHY, INGUINAL, ROBOT-ASSISTED, LAPAROSCOPIC: SHX7585

## 2023-06-08 SURGERY — LAPAROSCOPY, DIAGNOSTIC
Anesthesia: General

## 2023-06-08 MED ORDER — ONDANSETRON HCL 4 MG/2ML IJ SOLN
INTRAMUSCULAR | Status: AC
  Filled 2023-06-08: qty 2

## 2023-06-08 MED ORDER — DEXAMETHASONE SODIUM PHOSPHATE 10 MG/ML IJ SOLN
INTRAMUSCULAR | Status: AC
  Filled 2023-06-08: qty 1

## 2023-06-08 MED ORDER — LIDOCAINE HCL (PF) 2 % IJ SOLN
INTRAMUSCULAR | Status: DC | PRN
  Administered 2023-06-08: 60 mg via INTRADERMAL

## 2023-06-08 MED ORDER — BUPIVACAINE LIPOSOME 1.3 % IJ SUSP
INTRAMUSCULAR | Status: AC
  Filled 2023-06-08: qty 10

## 2023-06-08 MED ORDER — KETOROLAC TROMETHAMINE 30 MG/ML IJ SOLN
INTRAMUSCULAR | Status: DC | PRN
  Administered 2023-06-08: 15 mg via INTRAVENOUS

## 2023-06-08 MED ORDER — PHENYLEPHRINE HCL-NACL 20-0.9 MG/250ML-% IV SOLN
INTRAVENOUS | Status: DC | PRN
  Administered 2023-06-08: 30 ug/min via INTRAVENOUS

## 2023-06-08 MED ORDER — CEFAZOLIN SODIUM-DEXTROSE 2-4 GM/100ML-% IV SOLN
INTRAVENOUS | Status: AC
  Filled 2023-06-08: qty 100

## 2023-06-08 MED ORDER — CHLORHEXIDINE GLUCONATE CLOTH 2 % EX PADS: 6.0000 | MEDICATED_PAD | Freq: Once | CUTANEOUS | Status: DC

## 2023-06-08 MED ORDER — GABAPENTIN 100 MG PO CAPS
ORAL_CAPSULE | ORAL | Status: AC
  Filled 2023-06-08: qty 2

## 2023-06-08 MED ORDER — ORAL CARE MOUTH RINSE: 15.0000 mL | Freq: Once | OROMUCOSAL | Status: AC

## 2023-06-08 MED ORDER — OXYCODONE HCL 5 MG PO TABS
5.0000 mg | ORAL_TABLET | ORAL | 0 refills | Status: DC | PRN
Start: 2023-06-08 — End: 2023-06-23

## 2023-06-08 MED ORDER — PROPOFOL 10 MG/ML IV BOLUS
INTRAVENOUS | Status: AC
Start: 2023-06-08 — End: ?
  Filled 2023-06-08: qty 20

## 2023-06-08 MED ORDER — ACETAMINOPHEN 500 MG PO TABS
1000.0000 mg | ORAL_TABLET | ORAL | Status: AC
  Administered 2023-06-08: 1000 mg via ORAL

## 2023-06-08 MED ORDER — PHENYLEPHRINE HCL-NACL 20-0.9 MG/250ML-% IV SOLN
INTRAVENOUS | Status: AC
  Filled 2023-06-08: qty 250

## 2023-06-08 MED ORDER — CHLORHEXIDINE GLUCONATE 0.12 % MT SOLN
OROMUCOSAL | Status: AC
  Filled 2023-06-08: qty 15

## 2023-06-08 MED ORDER — OXYCODONE HCL 5 MG PO TABS
5.0000 mg | ORAL_TABLET | Freq: Once | ORAL | Status: AC
  Administered 2023-06-08: 5 mg via ORAL

## 2023-06-08 MED ORDER — LIDOCAINE HCL (PF) 2 % IJ SOLN
INTRAMUSCULAR | Status: AC
  Filled 2023-06-08: qty 5

## 2023-06-08 MED ORDER — PHENYLEPHRINE 80 MCG/ML (10ML) SYRINGE FOR IV PUSH (FOR BLOOD PRESSURE SUPPORT)
PREFILLED_SYRINGE | INTRAVENOUS | Status: AC
  Filled 2023-06-08: qty 10

## 2023-06-08 MED ORDER — PROPOFOL 500 MG/50ML IV EMUL
INTRAVENOUS | Status: DC | PRN
  Administered 2023-06-08: 55 ug/kg/min via INTRAVENOUS

## 2023-06-08 MED ORDER — ONDANSETRON HCL 4 MG/2ML IJ SOLN
INTRAMUSCULAR | Status: DC | PRN
  Administered 2023-06-08: 4 mg via INTRAVENOUS

## 2023-06-08 MED ORDER — FENTANYL CITRATE (PF) 100 MCG/2ML IJ SOLN
INTRAMUSCULAR | Status: AC
  Filled 2023-06-08: qty 2

## 2023-06-08 MED ORDER — SUGAMMADEX SODIUM 200 MG/2ML IV SOLN
INTRAVENOUS | Status: DC | PRN
  Administered 2023-06-08: 125.2 mg via INTRAVENOUS

## 2023-06-08 MED ORDER — ACETAMINOPHEN 500 MG PO TABS: 1000.0000 mg | ORAL_TABLET | Freq: Four times a day (QID) | ORAL | Status: AC | PRN

## 2023-06-08 MED ORDER — FENTANYL CITRATE (PF) 100 MCG/2ML IJ SOLN
INTRAMUSCULAR | Status: DC | PRN
  Administered 2023-06-08 (×2): 25 ug via INTRAVENOUS
  Administered 2023-06-08: 50 ug via INTRAVENOUS

## 2023-06-08 MED ORDER — BUPIVACAINE-EPINEPHRINE (PF) 0.5% -1:200000 IJ SOLN
INTRAMUSCULAR | Status: AC
  Filled 2023-06-08: qty 30

## 2023-06-08 MED ORDER — IBUPROFEN 600 MG PO TABS: 600.0000 mg | ORAL_TABLET | Freq: Three times a day (TID) | ORAL | 1 refills | Status: AC | PRN

## 2023-06-08 MED ORDER — ROCURONIUM BROMIDE 100 MG/10ML IV SOLN
INTRAVENOUS | Status: DC | PRN
  Administered 2023-06-08: 20 mg via INTRAVENOUS
  Administered 2023-06-08: 40 mg via INTRAVENOUS
  Administered 2023-06-08: 10 mg via INTRAVENOUS

## 2023-06-08 MED ORDER — BUPIVACAINE LIPOSOME 1.3 % IJ SUSP: 20.0000 mL | Freq: Once | INTRAMUSCULAR | Status: DC

## 2023-06-08 MED ORDER — ROCURONIUM BROMIDE 10 MG/ML (PF) SYRINGE
PREFILLED_SYRINGE | INTRAVENOUS | Status: AC
  Filled 2023-06-08: qty 10

## 2023-06-08 MED ORDER — FENTANYL CITRATE (PF) 100 MCG/2ML IJ SOLN: 25.0000 ug | INTRAMUSCULAR | Status: DC | PRN

## 2023-06-08 MED ORDER — BUPIVACAINE-EPINEPHRINE (PF) 0.5% -1:200000 IJ SOLN
INTRAMUSCULAR | Status: DC | PRN
  Administered 2023-06-08: 50 mL

## 2023-06-08 MED ORDER — EPHEDRINE 5 MG/ML INJ
INTRAVENOUS | Status: AC
  Filled 2023-06-08: qty 5

## 2023-06-08 MED ORDER — DEXAMETHASONE SODIUM PHOSPHATE 10 MG/ML IJ SOLN
INTRAMUSCULAR | Status: DC | PRN
  Administered 2023-06-08: 10 mg via INTRAVENOUS

## 2023-06-08 MED ORDER — ACETAMINOPHEN 500 MG PO TABS
ORAL_TABLET | ORAL | Status: AC
  Filled 2023-06-08: qty 2

## 2023-06-08 MED ORDER — PROPOFOL 10 MG/ML IV BOLUS
INTRAVENOUS | Status: DC | PRN
Start: 2023-06-08 — End: 2023-06-08
  Administered 2023-06-08: 80 mg via INTRAVENOUS

## 2023-06-08 MED ORDER — EPHEDRINE SULFATE-NACL 50-0.9 MG/10ML-% IV SOSY
PREFILLED_SYRINGE | INTRAVENOUS | Status: DC | PRN
  Administered 2023-06-08: 5 mg via INTRAVENOUS

## 2023-06-08 MED ORDER — GABAPENTIN 100 MG PO CAPS
200.0000 mg | ORAL_CAPSULE | ORAL | Status: AC
Start: 2023-06-08 — End: 2023-06-08
  Administered 2023-06-08: 200 mg via ORAL

## 2023-06-08 MED ORDER — DROPERIDOL 2.5 MG/ML IJ SOLN: 0.6250 mg | Freq: Once | INTRAMUSCULAR | Status: DC | PRN

## 2023-06-08 MED ORDER — CHLORHEXIDINE GLUCONATE 0.12 % MT SOLN
15.0000 mL | Freq: Once | OROMUCOSAL | Status: AC
  Administered 2023-06-08: 15 mL via OROMUCOSAL

## 2023-06-08 MED ORDER — CEFAZOLIN SODIUM-DEXTROSE 2-4 GM/100ML-% IV SOLN
2.0000 g | INTRAVENOUS | Status: AC
  Administered 2023-06-08: 2 g via INTRAVENOUS

## 2023-06-08 MED ORDER — OXYCODONE HCL 5 MG PO TABS
ORAL_TABLET | ORAL | Status: AC
  Filled 2023-06-08: qty 1

## 2023-06-08 MED ORDER — ONDANSETRON HCL 4 MG/2ML IJ SOLN
4.0000 mg | Freq: Once | INTRAMUSCULAR | Status: AC
  Administered 2023-06-08: 4 mg via INTRAVENOUS

## 2023-06-08 MED ORDER — 0.9 % SODIUM CHLORIDE (POUR BTL) OPTIME
TOPICAL | Status: DC | PRN
  Administered 2023-06-08: 500 mL

## 2023-06-08 MED ORDER — LACTATED RINGERS IV SOLN: INTRAVENOUS | Status: DC

## 2023-06-08 MED ORDER — PHENYLEPHRINE 80 MCG/ML (10ML) SYRINGE FOR IV PUSH (FOR BLOOD PRESSURE SUPPORT)
PREFILLED_SYRINGE | INTRAVENOUS | Status: DC | PRN
  Administered 2023-06-08: 80 ug via INTRAVENOUS

## 2023-06-08 MED ORDER — ONDANSETRON HCL 4 MG PO TABS: 4.0000 mg | ORAL_TABLET | Freq: Three times a day (TID) | ORAL | 1 refills | Status: AC | PRN

## 2023-06-08 SURGICAL SUPPLY — 69 items
BLADE SURG 15 STRL LF DISP TIS (BLADE) ×2 IMPLANT
BLADE SURG SZ11 CARB STEEL (BLADE) ×2 IMPLANT
CHLORAPREP W/TINT 26 (MISCELLANEOUS) IMPLANT
COVER TIP SHEARS 8 DVNC (MISCELLANEOUS) ×2 IMPLANT
COVER WAND RF STERILE (DRAPES) ×2 IMPLANT
CUTTER ECHEON FLEX ENDO 45 340 (ENDOMECHANICALS) IMPLANT
DERMABOND ADVANCED .7 DNX12 (GAUZE/BANDAGES/DRESSINGS) ×2 IMPLANT
DRAPE ARM DVNC X/XI (DISPOSABLE) ×6 IMPLANT
DRAPE COLUMN DVNC XI (DISPOSABLE) ×2 IMPLANT
DRAPE LAPAROTOMY 77X122 PED (DRAPES) ×2 IMPLANT
ELECTRODE CAUTERY BLDE TIP 2.5 (TIP) ×2 IMPLANT
ELECTRODE REM PT RTRN 9FT ADLT (ELECTROSURGICAL) ×2 IMPLANT
FORCEPS BPLR R/ABLATION 8 DVNC (INSTRUMENTS) ×2 IMPLANT
GAUZE 4X4 16PLY ~~LOC~~+RFID DBL (SPONGE) ×2 IMPLANT
GLOVE SURG SYN 7.0 (GLOVE) ×4 IMPLANT
GLOVE SURG SYN 7.0 PF PI (GLOVE) ×4 IMPLANT
GLOVE SURG SYN 7.5 E (GLOVE) ×4 IMPLANT
GLOVE SURG SYN 7.5 PF PI (GLOVE) ×4 IMPLANT
GOWN STRL REUS W/ TWL LRG LVL3 (GOWN DISPOSABLE) ×8 IMPLANT
IRRIGATION STRYKERFLOW (MISCELLANEOUS) ×2 IMPLANT
IV NS 1000ML BAXH (IV SOLUTION) IMPLANT
KIT PINK PAD W/HEAD ARE REST (MISCELLANEOUS) ×2 IMPLANT
KIT PINK PAD W/HEAD ARM REST (MISCELLANEOUS) ×2 IMPLANT
KIT TURNOVER KIT A (KITS) ×2 IMPLANT
LABEL OR SOLS (LABEL) ×2 IMPLANT
LHOOK LAP DISP 36CM (ELECTROSURGICAL) ×2 IMPLANT
LIGASURE LAP MARYLAND 5MM 37CM (ELECTROSURGICAL) IMPLANT
MANIFOLD NEPTUNE II (INSTRUMENTS) ×2 IMPLANT
MESH 3DMAX MID 4X6 LT LRG (Mesh General) IMPLANT
MESH 3DMAX MID 4X6 RT LRG (Mesh General) IMPLANT
MESH VENTRALEX ST 8CM LRG (Mesh General) IMPLANT
NDL DRIVE SUT CUT DVNC (INSTRUMENTS) ×2 IMPLANT
NDL HYPO 22X1.5 SAFETY MO (MISCELLANEOUS) ×2 IMPLANT
NDL INSUFFLATION 14GA 120MM (NEEDLE) ×2 IMPLANT
NEEDLE DRIVE SUT CUT DVNC (INSTRUMENTS) ×2 IMPLANT
NEEDLE HYPO 22X1.5 SAFETY MO (MISCELLANEOUS) ×2 IMPLANT
NEEDLE INSUFFLATION 14GA 120MM (NEEDLE) ×2 IMPLANT
NS IRRIG 500ML POUR BTL (IV SOLUTION) ×2 IMPLANT
OBTURATOR OPTICALSTD 8 DVNC (TROCAR) ×2 IMPLANT
PACK BASIN MINOR ARMC (MISCELLANEOUS) ×2 IMPLANT
PACK LAP CHOLECYSTECTOMY (MISCELLANEOUS) ×2 IMPLANT
RELOAD STAPLE 45 2.6 WHT THIN (STAPLE) IMPLANT
RELOAD STAPLE 45 3.6 BLU REG (STAPLE) IMPLANT
SCISSORS METZENBAUM CVD 33 (INSTRUMENTS) IMPLANT
SCISSORS MNPLR CVD DVNC XI (INSTRUMENTS) ×2 IMPLANT
SEAL UNIV 5-12 XI (MISCELLANEOUS) ×6 IMPLANT
SET TUBE SMOKE EVAC HIGH FLOW (TUBING) ×2 IMPLANT
SLEEVE ADV FIXATION 5X100MM (TROCAR) ×4 IMPLANT
SOLUTION ELECTROSURG ANTI STCK (MISCELLANEOUS) ×2 IMPLANT
SPONGE T-LAP 18X18 ~~LOC~~+RFID (SPONGE) ×2 IMPLANT
STAPLE RELOAD 45 WHT (STAPLE) IMPLANT
STAPLE RELOAD 45MM BLUE (STAPLE) IMPLANT
SUT ETHIBOND 0 MO6 C/R (SUTURE) ×2 IMPLANT
SUT MNCRL AB 4-0 PS2 18 (SUTURE) ×2 IMPLANT
SUT PROLENE 2 0 SH DA (SUTURE) IMPLANT
SUT STRATA 2-0 23CM CT-2 (SUTURE) ×2 IMPLANT
SUT VIC AB 2-0 SH 27XBRD (SUTURE) ×4 IMPLANT
SUT VIC AB 3-0 SH 27X BRD (SUTURE) ×2 IMPLANT
SUT VICRYL 0 UR6 27IN ABS (SUTURE) ×4 IMPLANT
SUTURE MNCRL 4-0 27XMF (SUTURE) ×2 IMPLANT
SYR 20ML LL LF (SYRINGE) ×2 IMPLANT
SYSTEM BAG RETRIEVAL 10MM (BASKET) IMPLANT
SYSTEM KII FIOS ACES ABD 5X100 (TROCAR) ×2 IMPLANT
TAPE TRANSPORE STRL 2 31045 (GAUZE/BANDAGES/DRESSINGS) ×2 IMPLANT
TRAP FLUID SMOKE EVACUATOR (MISCELLANEOUS) ×2 IMPLANT
TRAY FOLEY MTR SLVR 16FR STAT (SET/KITS/TRAYS/PACK) ×2 IMPLANT
TRAY FOLEY SLVR 16FR LF STAT (SET/KITS/TRAYS/PACK) ×2 IMPLANT
TROCAR BALLN GELPORT 12X130M (ENDOMECHANICALS) ×2 IMPLANT
WATER STERILE IRR 500ML POUR (IV SOLUTION) ×2 IMPLANT

## 2023-06-08 NOTE — Anesthesia Procedure Notes (Signed)
 Procedure Name: Intubation Date/Time: 06/08/2023 9:29 AM  Performed by: Coley Davis, CRNAPre-anesthesia Checklist: Patient identified, Emergency Drugs available, Suction available and Patient being monitored Patient Re-evaluated:Patient Re-evaluated prior to induction Oxygen Delivery Method: Circle system utilized Preoxygenation: Pre-oxygenation with 100% oxygen Induction Type: IV induction Ventilation: Mask ventilation without difficulty Laryngoscope Size: Mac and 4 Grade View: Grade II Tube type: Oral Tube size: 6.5 mm Number of attempts: 1 Airway Equipment and Method: Stylet and Oral airway Placement Confirmation: ETT inserted through vocal cords under direct vision, positive ETCO2 and breath sounds checked- equal and bilateral Secured at: 23 cm Tube secured with: Tape Dental Injury: Teeth and Oropharynx as per pre-operative assessment  Comments: Cords clear; anterior larynx; suggest ramp position with cricoid pressure to optimize view. CA

## 2023-06-08 NOTE — Progress Notes (Signed)
 Patient attempted to void x 2, per PACU RN patient voided unmeasured amount. Bladder scan was performed with 68 cc showing in bladder. Dr. Mauri Sous was notified that after 1700 mL of IV fluid was infused, bladder scan only showed 68 cc in bladder. Per Dr. Mauri Sous patient ok to be discharged home. Instructed patient and family if patient has not voided within 6 hours to go to emergency room. Family and patient verbalized understanding.

## 2023-06-08 NOTE — Discharge Instructions (Signed)
 Discharge Instructions: 1.  Patient may shower, but do not scrub wounds heavily and dab dry only. 2.  Do not submerge wounds in pool/tub until fully healed. 3.  Do not apply ointments or hydrogen peroxide to the wounds. 4.  May apply ice packs to the wounds for comfort. 5.  Do not drive while taking narcotics for pain control.  Prior to driving, make sure you are able to rotate right and left to look at blindspots without significant pain or discomfort. 6.  No heavy lifting or pushing of more than 10-15 lbs for 6 weeks. 7.  It is normal for there to be bruising and swelling at the groin areas after this surgery.  This will resolve on its own.

## 2023-06-08 NOTE — Op Note (Signed)
 Procedure Date:  06/08/2023  Pre-operative Diagnosis:  Reducible umbilical hernia and left groin pain.  Post-operative Diagnosis: Reducible 3.2 cm umbilical hernia, bilateral inguinal hernia.  Procedure: Diagnostic laparoscopy. Robotic assisted Bilateral Inguinal Hernia Repair Creation of bilateral Posterior Rectus-Transversalis Fascia Advancment Flap for Coverage of Pelvic Wound (200 cm) Open umbilical hernia repair with mesh.  Surgeon:  Marene Shape, MD  Anesthesia:  General endotracheal  Estimated Blood Loss:  15 ml  Specimens:  None  Complications:  None  Indications for Procedure:  This is a 83 y.o. female who presents with an umbilical hernia and left groin pain.  CT scan did not show any hernia defects in the groin, and on exam, no hernia was truly palpable.  Given her pain, we discussed the option for diagnostic laparoscopy.  The options of surgery versus observation were reviewed with the patient and/or family. The risks of bleeding, abscess or infection, recurrence of symptoms, potential for an open procedure, injury to surrounding structures, and chronic pain were all discussed with the patient and she was willing to proceed.  Description of Procedure: The patient was correctly identified in the preoperative area and brought into the operating room.  The patient was placed supine with VTE prophylaxis in place.  Appropriate time-outs were performed.  Anesthesia was induced and the patient was intubated.  Foley catheter was placed.  Appropriate antibiotics were infused.  The abdomen was prepped and draped in a sterile fashion. A supraumbilical incision was made. Cautery was used to dissect along the umbilical stalk and to separate the stalk from the underlying fascia.  This revealed a 3.2 cm umbilical hernia containing preperitoneal fat.  The fascial edges were cleared, retention sutured placed on the fascia, and a 12 mm port was inserted.  Pneumoperitoneum was obtained with  appropriate opening pressures.  Diagnostic laparoscopy was then performed and the patient was found to have bilateral inguinal hernias, with both sides having a component of femoral hernia.  A Veress needle was used to start dissecting the peritoneal flap on both sides.  Two 8-mm robotic ports were placed in the right and left lateral positions under direct visualization.  A large right and left Bard 3D Max Mesh, a 2-0 Vicryl, and two 2-0 vlock suture were placed through the umbilical port under direct visualization.  The [employer] platform was docked onto the patient, the camera was inserted and targeted, and the instruments were placed under direct visualization.  We started on the left side.  Using electocautery, the peritoneal and posterior rectus tissue flap was created.  The peritoneum on the left side was scored from the median umbilical ligament laterally towards the ASIS.  The flap was mobilized using robotic scissors and the bipolar instruments, creating a plane along the posterior rectus sheath and transversalis fascia down to the pubic tubercle medially. It was then further mobilized laterally across the inguinal canal and femoral vessels and onto the psoas muscle. The inferior epigastric vessels were identified and preserved. This created a posterior rectus and peritoneal flap measuring roughly 17 cm x 12 cm.  The hernia sac and contents were reduced and a cord lipoma excised.  The round ligament was divided.  A large left Bard 3D Max Mid mesh was placed with good overlap along all the potential hernia defects and secured in place with 2-0 Vicryl along the medial superomedial and superolateral aspects.   We then moved to the right side.  Using electocautery, the peritoneal and posterior rectus tissue flap was created.  The peritoneum on the right side was scored from the median umbilical ligament laterally towards the ASIS.  The flap was mobilized using robotic scissors and the bipolar instruments,  creating a plane along the posterior rectus sheath and transversalis fascia down to the pubic tubercle medially. It was then further mobilized laterally across the inguinal canal and femoral vessels and onto the psoas muscle. The inferior epigastric vessels were identified and preserved. This created a posterior rectus and peritoneal flap measuring roughly 17 cm x 12 cm.  The hernia sac and contents were reduced.  The round ligament was divided.  A large right Bard 3D Max Mid mesh was placed with good overlap along all the potential hernia defects and secured in place with 2-0 Vicryl along the medial superomedial and superolateral aspects.  Then, the peritoneal flap on both sides were advanced over the mesh and carried over to close the defect. A running 2-0 V lock suture was used to approximate the edge of the flap onto the peritoneum on each side.  All needles were removed under direct visualization.  The left cord lipoma was placed in an Endocatch bag.  The 8- mm ports were removed under direct visualization and the 12 mm port was removed.  An 8 cm hernia patch was inserted and splayed open appropriately.  2-0 Prolene sutures were used to secure the tails to the fascia.  Then, the hernia defect was closed with multiple 0 Ethibond sutures, incorporating the mesh into the fascial closure.  Then, local anesthetic was infused in all incisions as well as a bilateral ilioinguinal block.  The umbilical stalk was reattached using 2-0 Viryl and the umbilical incision was closed with 3-0 Vicryl and 4-0 Monocryl.  The other port incisions were closed with 4-0 Monocryl.  The wounds were cleaned and sealed with DermaBond.  Foley catheter was removed and the patient was emerged from anesthesia and extubated and brought to the recovery room for further management.  The patient tolerated the procedure well and all counts were correct at the end of the case.   Marene Shape, MD

## 2023-06-08 NOTE — Anesthesia Preprocedure Evaluation (Signed)
 Anesthesia Evaluation  Patient identified by MRN, date of birth, ID band Patient awake    Reviewed: Allergy & Precautions, H&P , NPO status , Patient's Chart, lab work & pertinent test results, reviewed documented beta blocker date and time   Airway Mallampati: III  TM Distance: >3 FB Neck ROM: full    Dental  (+) Dental Advidsory Given, Missing   Pulmonary neg shortness of breath, neg sleep apnea, neg COPD, Recent URI , Resolved   Pulmonary exam normal breath sounds clear to auscultation       Cardiovascular Exercise Tolerance: Good hypertension, (-) angina (-) Past MI and (-) Cardiac Stents Normal cardiovascular exam(-) dysrhythmias (-) Valvular Problems/Murmurs Rhythm:regular Rate:Normal     Neuro/Psych negative neurological ROS  negative psych ROS   GI/Hepatic Neg liver ROS,GERD  ,,  Endo/Other  negative endocrine ROS    Renal/GU negative Renal ROS  negative genitourinary   Musculoskeletal   Abdominal   Peds  Hematology negative hematology ROS (+)   Anesthesia Other Findings Past Medical History: 01/25/2017: Closed fracture of phalanx of foot No date: GERD (gastroesophageal reflux disease) No date: High cholesterol No date: Hypertension No date: Low bone density No date: Umbilical hernia 05/2023: URI (upper respiratory infection)     Comment:  currenty on levaquin   Reproductive/Obstetrics negative OB ROS                             Anesthesia Physical Anesthesia Plan  ASA: 2  Anesthesia Plan: General   Post-op Pain Management:    Induction: Intravenous  PONV Risk Score and Plan: 3 and Ondansetron , Dexamethasone  and Treatment may vary due to age or medical condition  Airway Management Planned: Oral ETT  Additional Equipment:   Intra-op Plan:   Post-operative Plan: Extubation in OR  Informed Consent: I have reviewed the patients History and Physical, chart, labs and  discussed the procedure including the risks, benefits and alternatives for the proposed anesthesia with the patient or authorized representative who has indicated his/her understanding and acceptance.     Dental Advisory Given  Plan Discussed with: Anesthesiologist, CRNA and Surgeon  Anesthesia Plan Comments:        Anesthesia Quick Evaluation

## 2023-06-08 NOTE — Transfer of Care (Signed)
 Immediate Anesthesia Transfer of Care Note  Patient: Dava Eppler  Procedure(s) Performed: LAPAROSCOPY, DIAGNOSTIC HERNIORRHAPHY, INGUINAL, ROBOT-ASSISTED, LAPAROSCOPIC (Bilateral) REPAIR, HERNIA, UMBILICAL, ADULT  Patient Location: PACU  Anesthesia Type:General  Level of Consciousness: drowsy and patient cooperative  Airway & Oxygen Therapy: Patient Spontanous Breathing and Patient connected to face mask oxygen  Post-op Assessment: Report given to RN and Post -op Vital signs reviewed and stable  Post vital signs: stable  Last Vitals:  Vitals Value Taken Time  BP 120/60 06/08/23 1238  Temp    Pulse 77 06/08/23 1240  Resp 14 06/08/23 1240  SpO2 100 % 06/08/23 1240  Vitals shown include unfiled device data.  Last Pain:  Vitals:   06/08/23 0809  TempSrc: Temporal  PainSc: 0-No pain         Complications: No notable events documented.

## 2023-06-08 NOTE — Interval H&P Note (Signed)
 History and Physical Interval Note:  06/08/2023 9:02 AM  Cassie Walters  has presented today for surgery, with the diagnosis of umbilical hernia reducible 3-10 cm  left groin pain.  The various methods of treatment have been discussed with the patient and family. After consideration of risks, benefits and other options for treatment, the patient has consented to  Procedure(s) with comments: LAPAROSCOPY, DIAGNOSTIC (N/A) HERNIORRHAPHY, INGUINAL, ROBOT-ASSISTED, LAPAROSCOPIC (Left) - open REPAIR, HERNIA, UMBILICAL, ADULT (N/A) as a surgical intervention.  The patient's history has been reviewed, patient examined, no change in status, stable for surgery.  I have reviewed the patient's chart and labs.  Questions were answered to the patient's satisfaction.     [name]

## 2023-06-09 ENCOUNTER — Encounter: Payer: Self-pay | Admitting: Surgery

## 2023-06-17 NOTE — Anesthesia Postprocedure Evaluation (Signed)
 Anesthesia Post Note  Patient: Shaqueria Wilmoth  Procedure(s) Performed: LAPAROSCOPY, DIAGNOSTIC HERNIORRHAPHY, INGUINAL, ROBOT-ASSISTED, LAPAROSCOPIC (Bilateral) REPAIR, HERNIA, UMBILICAL, ADULT  Patient location during evaluation: PACU Anesthesia Type: General Level of consciousness: awake and alert Pain management: pain level controlled Vital Signs Assessment: post-procedure vital signs reviewed and stable Respiratory status: spontaneous breathing, nonlabored ventilation, respiratory function stable and patient connected to nasal cannula oxygen Cardiovascular status: blood pressure returned to baseline and stable Postop Assessment: no apparent nausea or vomiting Anesthetic complications: no   No notable events documented.   Last Vitals:  Vitals:   06/08/23 1450 06/08/23 1547  BP: (!) 112/50 (!) 119/57  Pulse: (!) 56 (!) 57  Resp:  14  Temp:    SpO2:  100%    Last Pain:  Vitals:   06/08/23 1400  TempSrc:   PainSc: 2                  Vanice Genre

## 2023-06-21 ENCOUNTER — Encounter: Admitting: Surgery

## 2023-06-23 ENCOUNTER — Ambulatory Visit: Admitting: Surgery

## 2023-06-23 ENCOUNTER — Encounter: Payer: Self-pay | Admitting: Surgery

## 2023-06-23 VITALS — BP 147/68 | HR 92 | Ht 64.0 in | Wt 133.6 lb

## 2023-06-23 DIAGNOSIS — K4020 Bilateral inguinal hernia, without obstruction or gangrene, not specified as recurrent: Secondary | ICD-10-CM

## 2023-06-23 DIAGNOSIS — Z09 Encounter for follow-up examination after completed treatment for conditions other than malignant neoplasm: Secondary | ICD-10-CM

## 2023-06-23 DIAGNOSIS — K429 Umbilical hernia without obstruction or gangrene: Secondary | ICD-10-CM

## 2023-06-23 NOTE — Patient Instructions (Addendum)
 You are encourage to eat a higher protein diet to help with healing. You need to be sure your stools are soft and easy to pass with bowel movements daily.   Follow up here in 1 month.   GENERAL POST-OPERATIVE PATIENT INSTRUCTIONS   WOUND CARE INSTRUCTIONS:  Try to keep the wound dry and avoid ointments on the wound unless directed to do so.  If the wound becomes bright red and painful or starts to drain infected material that is not clear, please contact your physician immediately.  If the wound is mildly pink and has a thick firm ridge underneath it, this is normal, and is referred to as a healing ridge.  This will resolve over the next 4-6 weeks.  BATHING: You may shower if you have been informed of this by your surgeon. However, Please do not submerge in a tub, hot tub, or pool until incisions are completely sealed or have been told by your surgeon that you may do so.  DIET:  You may eat any foods that you can tolerate.  It is a good idea to eat a high fiber diet and take in plenty of fluids to prevent constipation.  If you do become constipated you may want to take a mild laxative or take ducolax tablets on a daily basis until your bowel habits are regular.  Constipation can be very uncomfortable, along with straining, after recent surgery.  ACTIVITY: You should not lift more than 20 pounds for 6 weeks total after surgery as it could put you at increased risk for complications.  Twenty pounds is roughly equivalent to a plastic bag of groceries. At that time- Listen to your body when lifting, if you have pain when lifting, stop and then try again in a few days. Soreness after doing exercises or activities of daily living is normal as you get back in to your normal routine.  MEDICATIONS:  Try to take narcotic medications and anti-inflammatory medications, such as tylenol , ibuprofen , naprosyn, etc., with food.  This will minimize stomach upset from the medication.  Should you develop nausea and  vomiting from the pain medication, or develop a rash, please discontinue the medication and contact your physician.  You should not drive, make important decisions, or operate machinery when taking narcotic pain medication.  SUNBLOCK Use sun block to incision area over the next year if this area will be exposed to sun. This helps decrease scarring and will allow you avoid a permanent darkened area over your incision.  QUESTIONS:  Please feel free to call our office if you have any questions, and we will be glad to assist you. [phone]

## 2023-06-23 NOTE — Progress Notes (Signed)
 06/23/2023  HPI: Cassie Walters is a 83 y.o. female s/p diagnostic laparoscopy, robotic assisted bilateral inguinal hernia repair, and open umbilical hernia repair on 06/08/23.  The patient was found to have bilateral inguinal hernias on initial laparoscopy, so both sides were repaired and the umbilical hernia was repaired as initially planned.  Patient reports some soreness at the hernia sites and some decreased appetite, as well as some constipation.  She's been taking Miralax and is adding lactulose .    Vital signs: BP (!) 147/68   Pulse 92   Ht 5\' 4"  (1.626 m)   Wt 133 lb 9.6 oz (60.6 kg)   SpO2 98%   BMI 22.93 kg/m    Physical Exam: Constitutional:  No acute distress Abdomen:  soft, non-distended, appropriately tender.  Incisions are healing well and are clean, dry, intact.  The patient has an area of firmness just inferior to the umbilical incision, likely scar tissue.  She also has a small seroma in the left groin likely at the prior femoral component of her hernia.  No evidence of hernia recurrence.  Assessment/Plan: This is a 83 y.o. female s/p diagnostic laparoscopy, robotic assisted bilateral inguinal hernia repair, and open umbilical hernia repair.  --Discussed with the patient again the intraoperative findings that were surprising in that she had bilateral inguinal hernias, despite of no particular findings on her CT scan.  Discussed the use of mesh for both sides and the umbilical repair.   --Currently she has a small seroma in the left groin, and discussed that this will improve with time as it reabsorbs more.  There may be some lingering seroma, but should improve compared to current exam.  The area of firmness at the umbilicus will improve as well. --Follow up in 4 weeks to check on her progress.   Marene Shape, MD [facility]

## 2023-07-28 ENCOUNTER — Encounter: Payer: Self-pay | Admitting: Surgery

## 2023-07-28 ENCOUNTER — Ambulatory Visit: Admitting: Surgery

## 2023-07-28 VITALS — BP 172/74 | HR 80 | Temp 97.9°F | Ht 64.0 in | Wt 133.0 lb

## 2023-07-28 DIAGNOSIS — K429 Umbilical hernia without obstruction or gangrene: Secondary | ICD-10-CM

## 2023-07-28 DIAGNOSIS — Z09 Encounter for follow-up examination after completed treatment for conditions other than malignant neoplasm: Secondary | ICD-10-CM

## 2023-07-28 DIAGNOSIS — K4020 Bilateral inguinal hernia, without obstruction or gangrene, not specified as recurrent: Secondary | ICD-10-CM

## 2023-07-28 NOTE — Progress Notes (Signed)
 07/28/2023  History of Present Illness: Cassie Walters is a 83 y.o. female s/p robotic assisted bilateral inguinal hernia repair and open umbilical hernia repair with mesh on 06/08/23. Patient presents for follow up.  On her last visit on 06/23/23, she was still having soreness in the groin areas and there was a small seroma in the left groin.  Today, she reports that she's doing much better and denies any pain.  She's working on constipation issues and is taking Miralax.  Past Medical History: Past Medical History:  Diagnosis Date   Closed fracture of phalanx of foot 01/25/2017   GERD (gastroesophageal reflux disease)    High cholesterol    Hypertension    Low bone density    Umbilical hernia    URI (upper respiratory infection) 05/2023   currenty on levaquin     Past Surgical History: Past Surgical History:  Procedure Laterality Date   ABDOMINAL HYSTERECTOMY     BUNIONECTOMY Bilateral    CHOLECYSTECTOMY     HERNIORRHAPHY, INGUINAL, ROBOT-ASSISTED, LAPAROSCOPIC Bilateral 06/08/2023   Procedure: HERNIORRHAPHY, INGUINAL, ROBOT-ASSISTED, LAPAROSCOPIC;  Surgeon: Emmalene Hare, MD;  Location: [facility] ORS;  Service: General;  Laterality: Bilateral;  open   LAPAROSCOPY N/A 06/08/2023   Procedure: LAPAROSCOPY, DIAGNOSTIC;  Surgeon: Emmalene Hare, MD;  Location: [facility] ORS;  Service: General;  Laterality: N/A;   UMBILICAL HERNIA REPAIR N/A 06/08/2023   Procedure: REPAIR, HERNIA, UMBILICAL, ADULT;  Surgeon: Emmalene Hare, MD;  Location: [facility] ORS;  Service: General;  Laterality: N/A;    Home Medications: Prior to Admission medications   Medication Sig Start Date End Date Taking? Authorizing Provider  acetaminophen  (TYLENOL ) 500 MG tablet Take 2 tablets (1,000 mg total) by mouth every 6 (six) hours as needed for mild pain (pain score 1-3). 06/08/23   Ashvin Adelson, MD  alendronate (FOSAMAX) 70 MG tablet Take 70 mg by mouth every Tuesday. 12/04/14   [provider]  amLODipine (NORVASC) 5 MG  tablet Take 5 mg by mouth every morning.    [provider]  cetirizine (ZYRTEC) 10 MG tablet Take 10 mg by mouth daily as needed for allergies.    [provider]  cholecalciferol (VITAMIN D) 25 MCG (1000 UNIT) tablet Take 1,000 Units by mouth daily. 03/30/21   [provider]  clotrimazole (LOTRIMIN) 1 % cream Apply 1 Application topically daily as needed (irritation). 01/13/17   [provider]  fluticasone (FLONASE) 50 MCG/ACT nasal spray Place 1 spray into both nostrils at bedtime. 02/04/21   [provider]  HYDROcodone  bit-homatropine (HYCODAN) 5-1.5 MG/5ML syrup Take 5 mLs by mouth at bedtime as needed for cough. 05/23/23   [provider]  ibuprofen  (ADVIL ) 600 MG tablet Take 1 tablet (600 mg total) by mouth every 8 (eight) hours as needed for moderate pain (pain score 4-6). 06/08/23   Emmalene Hare, MD  olmesartan (BENICAR) 20 MG tablet Take 20 mg by mouth every morning.    [provider]  omeprazole (PRILOSEC) 20 MG capsule Take 20 mg by mouth every morning. 04/02/21   [provider]  ondansetron  (ZOFRAN ) 4 MG tablet Take 1 tablet (4 mg total) by mouth every 8 (eight) hours as needed for nausea or vomiting. 06/08/23   Emmalene Hare, MD  Polyethyl Glycol-Propyl Glycol (SYSTANE OP) Place 1 drop into both eyes at bedtime.    [provider]  polyethylene glycol (MIRALAX / GLYCOLAX) 17 g packet Take 17 g by mouth at bedtime. 01/11/17   [provider]  pravastatin (  PRAVACHOL) 20 MG tablet Take 20 mg by mouth at bedtime.    [provider]  tiZANidine (ZANAFLEX) 2 MG tablet Take 2 mg by mouth every 6 (six) hours as needed for muscle spasms.    [provider]    Allergies: Allergies  Allergen Reactions   Hydrocodone  Nausea And Vomiting   Lisinopril Cough   Oxycodone  Nausea And Vomiting   Sulfa Antibiotics     Passed out on first dose   Latex Rash    On hands    Review of  Systems: Review of Systems  Constitutional:  Negative for chills and fever.  Respiratory:  Negative for shortness of breath.   Cardiovascular:  Negative for chest pain.  Gastrointestinal:  Negative for abdominal pain, nausea and vomiting.    Physical Exam BP (!) 172/74   Pulse 80   Temp 97.9 F (36.6 C) (Oral)   Ht 5' 4 (1.626 m)   Wt 133 lb (60.3 kg)   SpO2 100%   BMI 22.83 kg/m  CONSTITUTIONAL: No acute distress HEENT:  Normocephalic, atraumatic, extraocular motion intact. RESPIRATORY:  Normal respiratory effort without pathologic use of accessory muscles. CARDIOVASCULAR: Regular rhythm and rate GI: The abdomen is soft, non-distended, with no tenderness to palpation.  The area of firmness noted at the umbilical area is much softer today.  The left groin seroma is smaller today and softer as well.  No evidence of hernia recurrence in either groin or umbilicus.  NEUROLOGIC:  Motor and sensation is grossly normal.  Cranial nerves are grossly intact. PSYCH:  Alert and oriented to person, place and time. Affect is normal.   Assessment and Plan: This is a 83 y.o. female s/p robotic assisted bilateral inguinal hernia repair and open umbilical hernia repair with mesh.  --Discussed with the patient that the post-op changes initially noted are much improved and should continue to improve as everything heals.  She's now 6 weeks out from surgery and can also resume her normal activities. --Follow up as needed.  I spent 20 minutes dedicated to the care of this patient on the date of this encounter to include pre-visit review of records, face-to-face time with the patient discussing diagnosis and management, and any post-visit coordination of care.   Marene Shape, MD [facility]

## 2023-07-28 NOTE — Patient Instructions (Signed)
 Please call the office if you have any questions or concerns    Advised to pursue a goal of 25 to 30 g of fiber daily.  The majority of this may be through natural sources, advised to ensure a minimal daily fiber supplementation.  Various forms of supplements discussed.  Recommend Psyllium husk, with options of mixing with beverage or applesauce to make more tolerable. Strongly advised to consume more fluids(especially in proximity to fiber intake) and to ensure adequate hydration.   Watch color of urine to determine adequacy of hydration.  Clarity is pursued in urine output, and bowel activity that responds and corresponds to significant meal intake.   We need to avoid deferring having bowel movements, advised to take the time at the first sign of sensation, typically following meals, and in the morning.  The need to avoid more frequently, and the presence of flatus may indicate the need for bowel movement.  Do not defer for later.  Addition of MiraLAX (or its generic equivalent) may be needed ensure at least twice daily bowel movements.  If multiple doses of MiraLAX are necessary utilize them. Never skip a day... Do not tolerate a day without a bowel movement unless you are fasting.  To be regular, we must do the above EVERY day.   Soluble Fiber Dissolves in Water: Soluble fiber dissolves in water to form a gel-like substance. Slows Digestion: This type of fiber slows down digestion, which can help control blood sugar levels and lower cholesterol. Sources: Common sources include oats, beans, apples, citrus fruits, and psyllium. Benefits: Helps manage cholesterol levels. Aids in blood sugar control. Increases healthy gut bacteria, which can lower inflammation and improve digestion.  Insoluble Fiber Does Not Dissolve in Water: Insoluble fiber does not dissolve in water and remains mostly intact as it passes through the digestive system. Adds Bulk to Stool: It adds bulk to stool, which helps  promote regular bowel movements and prevent constipation. Sources: Common sources include whole grains, nuts, beans, and vegetables like cauliflower and potatoes. Benefits: Improves bowel health and regularity. Reduces the risk of colorectal conditions like hemorrhoids and diverticulitis. Supports insulin sensitivity in people with diabetes.  Both types of fiber are essential for overall health, and it's beneficial to include a 50/50 mix of both in your diet.
